# Patient Record
Sex: Female | Born: 1955 | Hispanic: No | Marital: Married | Smoking: Former smoker
Health system: Southern US, Community
[De-identification: ages and names within clinical notes are randomized; demographics above are authoritative.]

## PROBLEM LIST (undated history)

## (undated) DIAGNOSIS — G43909 Migraine, unspecified, not intractable, without status migrainosus: Secondary | ICD-10-CM

## (undated) DIAGNOSIS — M810 Age-related osteoporosis without current pathological fracture: Secondary | ICD-10-CM

## (undated) DIAGNOSIS — R011 Cardiac murmur, unspecified: Secondary | ICD-10-CM

## (undated) DIAGNOSIS — E559 Vitamin D deficiency, unspecified: Secondary | ICD-10-CM

## (undated) DIAGNOSIS — E119 Type 2 diabetes mellitus without complications: Secondary | ICD-10-CM

## (undated) DIAGNOSIS — E785 Hyperlipidemia, unspecified: Secondary | ICD-10-CM

## (undated) HISTORY — PX: BACK SURGERY: SHX140

## (undated) HISTORY — DX: Vitamin D deficiency, unspecified: E55.9

## (undated) HISTORY — PX: SHOULDER SURGERY: SHX246

## (undated) HISTORY — PX: ABDOMINAL HYSTERECTOMY: SHX81

## (undated) HISTORY — DX: Type 2 diabetes mellitus without complications: E11.9

## (undated) HISTORY — PX: APPENDECTOMY: SHX54

## (undated) HISTORY — DX: Cardiac murmur, unspecified: R01.1

## (undated) HISTORY — PX: CARPAL TUNNEL RELEASE: SHX101

## (undated) HISTORY — DX: Migraine, unspecified, not intractable, without status migrainosus: G43.909

## (undated) HISTORY — PX: TUBAL LIGATION: SHX77

## (undated) HISTORY — DX: Hyperlipidemia, unspecified: E78.5

## (undated) HISTORY — DX: Age-related osteoporosis without current pathological fracture: M81.0

---

## 2017-03-27 ENCOUNTER — Ambulatory Visit (INDEPENDENT_AMBULATORY_CARE_PROVIDER_SITE_OTHER): Payer: Commercial Managed Care - PPO | Admitting: Neurology

## 2017-03-27 ENCOUNTER — Encounter: Payer: Self-pay | Admitting: Neurology

## 2017-03-27 DIAGNOSIS — R531 Weakness: Secondary | ICD-10-CM

## 2017-03-27 DIAGNOSIS — R202 Paresthesia of skin: Secondary | ICD-10-CM

## 2017-03-27 DIAGNOSIS — M6281 Muscle weakness (generalized): Secondary | ICD-10-CM

## 2017-03-27 NOTE — Progress Notes (Signed)
PATIENT: Sheila Mcdowell DOB: September 09, 1955  Chief Complaint  Patient presents with  . Transient Ischemic Attack    She is concerned about having stroke- like symptoms.  She has been feeling off balance, easily drops things, numbness in face/lips, blurred vision and headaches.  Symptoms have been occurring over the last two months.  Says she had a normal CT scan of her head last month.  Her PCP started aspirin 81mg  daily as a precaution.  Marland Kitchen PCP    Doretha Sou, MD  . Insomnia     HISTORICAL  Sheila Mcdowell is a 61 year old female, seen in refer by her primary care doctor Doretha Sou for evaluation of transient ischemic attack, initial evaluation was on March 27 2017.  I reviewed and summarized the referring note, she has history of diabetes, hyperlipidemia, bilateral carpal tunnel release surgery, low back surgery November 2016,  She started to notice mild unsteady gait, left facial weakness, numbness, drop things from her hand especially left hand since August 2018, she could not recall the date of the symptoms onset, symptoms seems to start subacutely, over the past few months, mild improvement, she complains of left facial movements, asymmetry of left mouth corner, no dysarthria, no dysphagia, she also noticed when she pick up things, she tends to drop things from both hands, especially left hand, she denies significant neck pain, mild unsteady gait, no bowel and bladder incontinence,  She does have intermittent bilateral lower extremity paresthesia, achy pain, heaviness, difficulty walking   REVIEW OF SYSTEMS: Full 14 system review of systems performed and notable only for weight loss, palpitation, murmur, itching, blurry vision, snoring, constipation, increased thirst, joint pain, achy muscles, headaches, numbness insomnia sleepiness snoring  ALLERGIES: Allergies  Allergen Reactions  . Meperidine Nausea And Vomiting    Other reaction(s): RASH    HOME  MEDICATIONS: Current Outpatient Prescriptions  Medication Sig Dispense Refill  . alendronate (FOSAMAX) 70 MG tablet Take 70 mg by mouth once a week. Take with a full glass of water on an empty stomach.    . ASPIRIN 81 PO Take 81 mg by mouth daily.    Marland Kitchen gabapentin (NEURONTIN) 300 MG capsule Take 300 mg by mouth 3 (three) times daily.    . metFORMIN (GLUCOPHAGE) 500 MG tablet Take 500 mg by mouth 2 (two) times daily.    . Multiple Vitamins-Minerals (MULTIVITAMIN PO) Take 1 capsule by mouth daily.    Marland Kitchen omeprazole (PRILOSEC) 20 MG capsule Take 20 mg by mouth daily.    . rosuvastatin (CRESTOR) 10 MG tablet Take 10 mg by mouth daily.    Marland Kitchen terbinafine (LAMISIL) 250 MG tablet Take 250 mg by mouth daily.    Marland Kitchen topiramate (TOPAMAX) 50 MG tablet Take 50 mg by mouth daily.     No current facility-administered medications for this visit.     PAST MEDICAL HISTORY: Past Medical History:  Diagnosis Date  . Diabetes ([place])   . Heart murmur   . Hyperlipemia   . Migraine   . Osteoporosis   . Vitamin D deficiency     PAST SURGICAL HISTORY: Past Surgical History:  Procedure Laterality Date  . ABDOMINAL HYSTERECTOMY    . APPENDECTOMY    . BACK SURGERY    . CARPAL TUNNEL RELEASE Bilateral   . CESAREAN SECTION     x 2  . SHOULDER SURGERY Right   . TUBAL LIGATION      FAMILY HISTORY: Family History  Problem Relation Age of Onset  .  Heart disease Mother   . Diabetes Father   . Heart disease Father   . Hypertension Father   . Lung cancer Brother   . Hypertension Brother   . Diabetes Brother   . Diabetes Brother   . Diabetes Brother   . Heart attack Brother     SOCIAL HISTORY:  Social History   Social History  . Marital status: Married    Spouse name: N/A  . Number of children: 4  . Years of education: HS   Occupational History  . [employer]    Social History Main Topics  . Smoking status: Former Smoker    Types: Cigarettes  . Smokeless tobacco: Never Used     Comment:  03/27/17 - quit 4 years ago  . Alcohol use No  . Drug use: No  . Sexual activity: Not on file   Other Topics Concern  . Not on file   Social History Narrative   Lives at home husband.   Right-handed.   1 cup coffee per day.     PHYSICAL EXAM   Vitals:   03/27/17 1456  BP: 130/79  Pulse: 83  Weight: 150 lb 4 oz (68.2 kg)  Height: 4\' 11"  (1.499 m)    Not recorded      Body mass index is 30.35 kg/m.  PHYSICAL EXAMNIATION:  Gen: NAD, conversant, well nourised, obese, well groomed                     Cardiovascular: Regular rate rhythm, no peripheral edema, warm, nontender. Eyes: Conjunctivae clear without exudates or hemorrhage Neck: Supple, no carotid bruits. Pulmonary: Clear to auscultation bilaterally   NEUROLOGICAL EXAM:  MENTAL STATUS: Speech:    Speech is normal; fluent and spontaneous with normal comprehension.  Cognition:     Orientation to time, place and person     Normal recent and remote memory     Normal Attention span and concentration     Normal Language, naming, repeating,spontaneous speech     Fund of knowledge   CRANIAL NERVES: CN II: Visual fields are full to confrontation. Fundoscopic exam is normal with sharp discs and no vascular changes. Pupils are round equal and briskly reactive to light. CN III, IV, VI: extraocular movement are normal. No ptosis. CN V: Facial sensation is intact to pinprick in all 3 divisions bilaterally. Corneal responses are intact.  CN VII: Mild left lower face weakness CN VIII: Hearing is normal to rubbing fingers CN IX, X: Palate elevates symmetrically. Phonation is normal. CN XI: Head turning and shoulder shrug are intact CN XII: Tongue is midline with normal movements and no atrophy.  MOTOR: Mild fixation of left upper extremity on rapid rotating movement  REFLEXES: Reflexes are 2+ and symmetric at the biceps, triceps, knees, and ankles. Plantar responses are flexor.  SENSORY: Intact to light touch,  pinprick, positional sensation and vibratory sensation are intact in fingers and toes with exception of decreased light touch at the left V2 and V3 distribution  COORDINATION: Rapid alternating movements and fine finger movements are intact. There is no dysmetria on finger-to-nose and heel-knee-shin.    GAIT/STANCE: Posture is normal. Gait is steady with normal steps, base, arm swing, and turning. Heel and toe walking are normal. Tandem gait is normal.  Romberg is absent.   DIAGNOSTIC DATA (LABS, IMAGING, TESTING) - I reviewed patient records, labs, notes, testing and imaging myself where available.   ASSESSMENT AND PLAN  Allura Doepke is a 61 y.o.  female   Left facial and left upper extremity weakness, numbness  Localize the problem to right thalamus/posterior limb of internal capsule  She has vascular risk factor of aging, hypertension, hyperlipidemia  Complete evaluation with MRI of the brain  Increase water intake  Continue aspirin daily   Marcial Pacas, M.D. Ph.D.  [facility] [address], [place], [place] [address] Ph: [phone] Fax: [phone]  CC: Doretha Sou, MD

## 2017-04-05 ENCOUNTER — Ambulatory Visit (INDEPENDENT_AMBULATORY_CARE_PROVIDER_SITE_OTHER): Payer: Commercial Managed Care - PPO

## 2017-04-05 DIAGNOSIS — M6281 Muscle weakness (generalized): Secondary | ICD-10-CM

## 2017-04-06 ENCOUNTER — Telehealth: Payer: Self-pay | Admitting: *Deleted

## 2017-04-06 NOTE — Telephone Encounter (Signed)
Spoke with patient and informed her that there is no significant abnormality on her MRI of the brain. She had no questions, verbalized understanding.

## 2017-06-08 ENCOUNTER — Ambulatory Visit: Payer: Commercial Managed Care - PPO | Admitting: Neurology

## 2017-07-10 ENCOUNTER — Ambulatory Visit: Payer: Commercial Managed Care - PPO | Admitting: Neurology

## 2020-09-20 ENCOUNTER — Other Ambulatory Visit: Payer: Self-pay

## 2020-09-20 ENCOUNTER — Encounter (HOSPITAL_BASED_OUTPATIENT_CLINIC_OR_DEPARTMENT_OTHER): Payer: Self-pay | Admitting: *Deleted

## 2020-09-20 DIAGNOSIS — N178 Other acute kidney failure: Secondary | ICD-10-CM | POA: Insufficient documentation

## 2020-09-20 DIAGNOSIS — Z87891 Personal history of nicotine dependence: Secondary | ICD-10-CM | POA: Diagnosis not present

## 2020-09-20 DIAGNOSIS — Z79899 Other long term (current) drug therapy: Secondary | ICD-10-CM | POA: Diagnosis not present

## 2020-09-20 DIAGNOSIS — E876 Hypokalemia: Secondary | ICD-10-CM | POA: Diagnosis not present

## 2020-09-20 DIAGNOSIS — E119 Type 2 diabetes mellitus without complications: Secondary | ICD-10-CM | POA: Insufficient documentation

## 2020-09-20 DIAGNOSIS — N201 Calculus of ureter: Secondary | ICD-10-CM | POA: Diagnosis not present

## 2020-09-20 DIAGNOSIS — D3501 Benign neoplasm of right adrenal gland: Secondary | ICD-10-CM | POA: Diagnosis not present

## 2020-09-20 DIAGNOSIS — Z7984 Long term (current) use of oral hypoglycemic drugs: Secondary | ICD-10-CM | POA: Insufficient documentation

## 2020-09-20 DIAGNOSIS — R1084 Generalized abdominal pain: Secondary | ICD-10-CM | POA: Diagnosis present

## 2020-09-20 DIAGNOSIS — Z7982 Long term (current) use of aspirin: Secondary | ICD-10-CM | POA: Diagnosis not present

## 2020-09-20 NOTE — ED Triage Notes (Addendum)
RLQ pain for several days. BM normal. Denies urinary Sx. States"It feels like when I had my appendix out". Pt vomiting in triage

## 2020-09-21 ENCOUNTER — Emergency Department (HOSPITAL_BASED_OUTPATIENT_CLINIC_OR_DEPARTMENT_OTHER)
Admission: EM | Admit: 2020-09-21 | Discharge: 2020-09-21 | Disposition: A | Discharge status: Home / Self Care | Payer: Medicare Other | Attending: Emergency Medicine | Admitting: Emergency Medicine

## 2020-09-21 ENCOUNTER — Emergency Department (HOSPITAL_BASED_OUTPATIENT_CLINIC_OR_DEPARTMENT_OTHER): Payer: Medicare Other

## 2020-09-21 DIAGNOSIS — N201 Calculus of ureter: Secondary | ICD-10-CM

## 2020-09-21 DIAGNOSIS — E876 Hypokalemia: Secondary | ICD-10-CM

## 2020-09-21 DIAGNOSIS — D3501 Benign neoplasm of right adrenal gland: Secondary | ICD-10-CM

## 2020-09-21 DIAGNOSIS — N289 Disorder of kidney and ureter, unspecified: Secondary | ICD-10-CM

## 2020-09-21 LAB — CBC WITH DIFFERENTIAL/PLATELET
Abs Immature Granulocytes: 0.06 10*3/uL (ref 0.00–0.07)
Basophils Absolute: 0 10*3/uL (ref 0.0–0.1)
Basophils Relative: 0 %
Eosinophils Absolute: 0.1 10*3/uL (ref 0.0–0.5)
Eosinophils Relative: 1 %
HCT: 39.2 % (ref 36.0–46.0)
Hemoglobin: 12.9 g/dL (ref 12.0–15.0)
Immature Granulocytes: 1 %
Lymphocytes Relative: 16 %
Lymphs Abs: 1.8 10*3/uL (ref 0.7–4.0)
MCH: 28.2 pg (ref 26.0–34.0)
MCHC: 32.9 g/dL (ref 30.0–36.0)
MCV: 85.6 fL (ref 80.0–100.0)
Monocytes Absolute: 0.5 10*3/uL (ref 0.1–1.0)
Monocytes Relative: 4 %
Neutro Abs: 8.6 10*3/uL — ABNORMAL HIGH (ref 1.7–7.7)
Neutrophils Relative %: 78 %
Platelets: 253 10*3/uL (ref 150–400)
RBC: 4.58 MIL/uL (ref 3.87–5.11)
RDW: 13.9 % (ref 11.5–15.5)
WBC: 11 10*3/uL — ABNORMAL HIGH (ref 4.0–10.5)
nRBC: 0 % (ref 0.0–0.2)

## 2020-09-21 LAB — COMPREHENSIVE METABOLIC PANEL
ALT: 20 U/L (ref 0–44)
AST: 27 U/L (ref 15–41)
Albumin: 4.2 g/dL (ref 3.5–5.0)
Alkaline Phosphatase: 67 U/L (ref 38–126)
Anion gap: 12 (ref 5–15)
BUN: 24 mg/dL — ABNORMAL HIGH (ref 8–23)
CO2: 19 mmol/L — ABNORMAL LOW (ref 22–32)
Calcium: 9.4 mg/dL (ref 8.9–10.3)
Chloride: 107 mmol/L (ref 98–111)
Creatinine, Ser: 1.1 mg/dL — ABNORMAL HIGH (ref 0.44–1.00)
GFR, Estimated: 56 mL/min — ABNORMAL LOW (ref >60)
Glucose, Bld: 179 mg/dL — ABNORMAL HIGH (ref 70–99)
Potassium: 3.4 mmol/L — ABNORMAL LOW (ref 3.5–5.1)
Sodium: 138 mmol/L (ref 135–145)
Total Bilirubin: 0.3 mg/dL (ref 0.3–1.2)
Total Protein: 8.4 g/dL — ABNORMAL HIGH (ref 6.5–8.1)

## 2020-09-21 LAB — URINALYSIS, ROUTINE W REFLEX MICROSCOPIC
Bilirubin Urine: NEGATIVE
Glucose, UA: NEGATIVE mg/dL
Ketones, ur: NEGATIVE mg/dL
Leukocytes,Ua: NEGATIVE
Nitrite: NEGATIVE
Protein, ur: 30 mg/dL — AB
Specific Gravity, Urine: 1.02 (ref 1.005–1.030)
pH: 7 (ref 5.0–8.0)

## 2020-09-21 LAB — URINALYSIS, MICROSCOPIC (REFLEX)

## 2020-09-21 LAB — LIPASE, BLOOD: Lipase: 57 U/L — ABNORMAL HIGH (ref 11–51)

## 2020-09-21 MED ORDER — LACTATED RINGERS IV BOLUS
1000.0000 mL | Freq: Once | INTRAVENOUS | Status: AC
  Administered 2020-09-21: 1000 mL via INTRAVENOUS

## 2020-09-21 MED ORDER — MORPHINE SULFATE (PF) 4 MG/ML IV SOLN
4.0000 mg | Freq: Once | INTRAVENOUS | Status: AC
  Administered 2020-09-21: 4 mg via INTRAVENOUS
  Filled 2020-09-21: qty 1

## 2020-09-21 MED ORDER — OXYCODONE HCL 5 MG PO TABS: 5.0000 mg | ORAL_TABLET | ORAL | 0 refills | Status: DC | PRN

## 2020-09-21 MED ORDER — ONDANSETRON HCL 4 MG PO TABS: 4.0000 mg | ORAL_TABLET | Freq: Four times a day (QID) | ORAL | 0 refills | Status: DC | PRN

## 2020-09-21 MED ORDER — POTASSIUM CHLORIDE CRYS ER 20 MEQ PO TBCR
40.0000 meq | EXTENDED_RELEASE_TABLET | Freq: Once | ORAL | Status: AC
  Administered 2020-09-21: 40 meq via ORAL
  Filled 2020-09-21: qty 2

## 2020-09-21 MED ORDER — TAMSULOSIN HCL 0.4 MG PO CAPS: 0.4000 mg | ORAL_CAPSULE | Freq: Every day | ORAL | 0 refills | Status: DC

## 2020-09-21 MED ORDER — ONDANSETRON HCL 4 MG/2ML IJ SOLN
4.0000 mg | Freq: Once | INTRAMUSCULAR | Status: AC
  Administered 2020-09-21: 4 mg via INTRAVENOUS
  Filled 2020-09-21: qty 2

## 2020-09-21 MED ORDER — IOHEXOL 300 MG/ML  SOLN
100.0000 mL | Freq: Once | INTRAMUSCULAR | Status: AC | PRN
  Administered 2020-09-21: 100 mL via INTRAVENOUS

## 2020-09-21 NOTE — ED Provider Notes (Signed)
[facility] EMERGENCY DEPARTMENT Provider Note   CSN: [id number] Arrival date & time: 09/20/20  2336    History Chief Complaint  Patient presents with  . Abdominal Pain    Sheila Mcdowell is a 65 y.o. female.  The history is provided by the patient.  Abdominal Pain She has history of diabetes, hyperlipidemia and comes in complaining of generalized abdominal pain for the last 2 days.  Pain does radiate into her back.  It is worse when she eats.  There has been associated nausea and vomiting.  She denies diarrhea and has been having normal bowel movements.  Pain is not affected by bowel movement or emesis.  She denies fever, chills, sweats.  She rates pain at 10/10.  Pain is as severe as what she had with appendicitis although appendicitis pain was much more localized.  Of note, she has had several abdominal surgeries.  Past Medical History:  Diagnosis Date  . Diabetes ([place])   . Heart murmur   . Hyperlipemia   . Migraine   . Osteoporosis   . Vitamin D deficiency     Patient Active Problem List   Diagnosis Date Noted  . Left-sided weakness 03/27/2017  . Paresthesia 03/27/2017  . Muscle weakness (generalized) 03/27/2017    Past Surgical History:  Procedure Laterality Date  . ABDOMINAL HYSTERECTOMY    . APPENDECTOMY    . BACK SURGERY    . CARPAL TUNNEL RELEASE Bilateral   . CESAREAN SECTION     x 2  . SHOULDER SURGERY Right   . TUBAL LIGATION       OB History   No obstetric history on file.     Family History  Problem Relation Age of Onset  . Heart disease Mother   . Diabetes Father   . Heart disease Father   . Hypertension Father   . Lung cancer Brother   . Hypertension Brother   . Diabetes Brother   . Diabetes Brother   . Diabetes Brother   . Heart attack Brother     Social History   Tobacco Use  . Smoking status: Former Smoker    Types: Cigarettes  . Smokeless tobacco: Never Used  . Tobacco comment: 03/27/17 - quit 4 years ago  Vaping  Use  . Vaping Use: Never used  Substance Use Topics  . Alcohol use: No  . Drug use: No    Home Medications Prior to Admission medications   Medication Sig Start Date End Date Taking? Authorizing Provider  alendronate (FOSAMAX) 70 MG tablet Take 70 mg by mouth once a week. Take with a full glass of water on an empty stomach.    [provider]  ASPIRIN 81 PO Take 81 mg by mouth daily.    [provider]  gabapentin (NEURONTIN) 300 MG capsule Take 300 mg by mouth 3 (three) times daily. 01/12/17   [provider]  metFORMIN (GLUCOPHAGE) 500 MG tablet Take 500 mg by mouth 2 (two) times daily.    [provider]  Multiple Vitamins-Minerals (MULTIVITAMIN PO) Take 1 capsule by mouth daily.    [provider]  omeprazole (PRILOSEC) 20 MG capsule Take 20 mg by mouth daily.    [provider]  rosuvastatin (CRESTOR) 10 MG tablet Take 10 mg by mouth daily. 02/22/17   [provider]  terbinafine (LAMISIL) 250 MG tablet Take 250 mg by mouth daily. 03/20/17   [provider]  topiramate (TOPAMAX) 50 MG tablet Take 50 mg  by mouth daily. 03/23/17   [provider]    Allergies    Meperidine  Review of Systems   Review of Systems  Gastrointestinal: Positive for abdominal pain.  All other systems reviewed and are negative.   Physical Exam Updated Vital Signs BP (!) 165/98 (BP Location: Left Arm)   Pulse 61   Temp 98.5 F (36.9 C) (Oral)   Resp 16   Ht 4\' 11"  (1.499 m)   Wt 77.1 kg   SpO2 98%   BMI 34.34 kg/m   Physical Exam Vitals and nursing note reviewed.   65 year old female, resting comfortably and in no acute distress. Vital signs are significant for elevated blood pressure. Oxygen saturation is 98%, which is normal. Head is normocephalic and atraumatic. PERRLA, EOMI. Oropharynx is clear. Neck is nontender and supple without adenopathy or JVD. Back is nontender and there is no CVA tenderness. Lungs are  clear without rales, wheezes, or rhonchi. Chest is nontender. Heart has regular rate and rhythm without murmur. Abdomen is soft, flat, with mild tenderness diffusely.  There is no rebound or guarding.  There are no masses or hepatosplenomegaly and peristalsis is slightly hypoactive. Extremities have no cyanosis or edema, full range of motion is present. Skin is warm and dry without rash. Neurologic: Mental status is normal, cranial nerves are intact, there are no motor or sensory deficits.  ED Results / Procedures / Treatments   Labs (all labs ordered are listed, but only abnormal results are displayed) Labs Reviewed  LIPASE, BLOOD - Abnormal; Notable for the following components:      Result Value   Lipase 57 (*)    All other components within normal limits  COMPREHENSIVE METABOLIC PANEL - Abnormal; Notable for the following components:   Potassium 3.4 (*)    CO2 19 (*)    Glucose, Bld 179 (*)    BUN 24 (*)    Creatinine, Ser 1.10 (*)    Total Protein 8.4 (*)    GFR, Estimated 56 (*)    All other components within normal limits  CBC WITH DIFFERENTIAL/PLATELET - Abnormal; Notable for the following components:   WBC 11.0 (*)    Neutro Abs 8.6 (*)    All other components within normal limits  URINALYSIS, ROUTINE W REFLEX MICROSCOPIC - Abnormal; Notable for the following components:   Hgb urine dipstick MODERATE (*)    Protein, ur 30 (*)    All other components within normal limits  URINALYSIS, MICROSCOPIC (REFLEX) - Abnormal; Notable for the following components:   Bacteria, UA RARE (*)    All other components within normal limits   Radiology CT ABDOMEN PELVIS W CONTRAST  Result Date: 09/21/2020 CLINICAL DATA:  Right lower quadrant pain EXAM: CT ABDOMEN AND PELVIS WITH CONTRAST TECHNIQUE: Multidetector CT imaging of the abdomen and pelvis was performed using the standard protocol following bolus administration of intravenous contrast. CONTRAST:  125mL OMNIPAQUE IOHEXOL 300 MG/ML   SOLN COMPARISON:  12/04/2016 FINDINGS: Lower chest: Lung bases are clear. No effusions. Heart is normal size. Hepatobiliary: Diffuse low-density throughout the liver compatible with fatty infiltration. No focal abnormality. Gallbladder unremarkable. Pancreas: No focal abnormality or ductal dilatation. Spleen: No focal abnormality.  Normal size. Adrenals/Urinary Tract: Moderate right hydronephrosis and hydroureter due to 3 mm distal right ureteral stone just beyond the pelvic brim. Bilateral nephrolithiasis. Perinephric stranding around the right kidney. Slight delayed excretion of contrast. 3.7 cm mass in the right adrenal gland is similar to prior study most  compatible with adenoma given the stability over several years. Left adrenal gland and urinary bladder unremarkable. Stomach/Bowel: Stomach, large and small bowel grossly unremarkable. Vascular/Lymphatic: Aortoiliac atherosclerosis. No evidence of aneurysm or adenopathy. Reproductive: Prior hysterectomy.  No adnexal masses. Other: No free fluid or free air. Musculoskeletal: No acute bony abnormality. IMPRESSION: 3 mm distal right ureteral stone. Mild right hydronephrosis and perinephric stranding. Bilateral nephrolithiasis. Hepatic steatosis. Aortic atherosclerosis. Electronically Signed   By: Rolm Baptise M.D.   On: 09/21/2020 03:03    Procedures Procedures   Medications Ordered in ED Medications  ondansetron (ZOFRAN) injection 4 mg (has no administration in time range)  morphine 4 MG/ML injection 4 mg (has no administration in time range)  lactated ringers bolus 1,000 mL (has no administration in time range)    ED Course  I have reviewed the triage vital signs and the nursing notes.  Pertinent labs & imaging results that were available during my care of the patient were reviewed by me and considered in my medical decision making (see chart for details).  MDM Rules/Calculators/A&P   Generalized abdominal pain with vomiting, etiology  unclear.  Consider diverticulitis, small bowel obstruction, pancreatitis, ureterolithiasis.  This differential includes conditions with significant morbidity and mortality.  Old records were reviewed,, and she has no relevant past visits.  CT abdomen and pelvis on 12/04/2016 at [facility] showed a lower abdominal wall hernia, right adrenal gland adenoma, fatty infiltration of the liver, aortic and iliac atherosclerosis.  She will be given IV fluids, ondansetron, morphine.  Screening labs are obtained and will send for CT of abdomen and pelvis.  Urinalysis does show hematuria.  Metabolic panel shows mild renal insufficiency and mild hypokalemia.  She is given a dose of oral potassium.  CBC shows mild leukocytosis without left shift.  CT scan shows stable adrenal adenoma, 3 mm distal right ureteral calculus with right hydronephrosis.  This is apparently the cause of her pain.  She got good relief of pain with morphine.  She is discharged with prescriptions for tamsulosin, oxycodone, ondansetron and is referred to urology for follow-up.  Return precautions discussed.  Final Clinical Impression(s) / ED Diagnoses Final diagnoses:  Ureterolithiasis  Hypokalemia  Renal insufficiency  Adrenal adenoma, right    Rx / DC Orders ED Discharge Orders         Ordered    tamsulosin (FLOMAX) 0.4 MG CAPS capsule  Daily        09/21/20 0331    oxyCODONE (ROXICODONE) 5 MG immediate release tablet  Every 4 hours PRN        09/21/20 0331    ondansetron (ZOFRAN) 4 MG tablet  Every 6 hours PRN        09/21/20 2956           Delora Fuel, MD 21/30/86 [phone]

## 2020-09-21 NOTE — Discharge Instructions (Signed)
Your CT scan shows you have a kidney stone.  The stone is likely to pass by itself.  However, if your pain is not being adequately controlled or if you start running a fever, return to the emergency department immediately.  Please follow-up with the urologist.  If the stone does not pass by itself, the urologist has several techniques to try to get the stone to pass.

## 2020-10-02 ENCOUNTER — Emergency Department (HOSPITAL_BASED_OUTPATIENT_CLINIC_OR_DEPARTMENT_OTHER)
Admission: EM | Admit: 2020-10-02 | Discharge: 2020-10-02 | Disposition: A | Discharge status: Home / Self Care | Payer: Medicare Other | Attending: Emergency Medicine | Admitting: Emergency Medicine

## 2020-10-02 ENCOUNTER — Emergency Department (HOSPITAL_BASED_OUTPATIENT_CLINIC_OR_DEPARTMENT_OTHER): Payer: Medicare Other

## 2020-10-02 ENCOUNTER — Encounter (HOSPITAL_BASED_OUTPATIENT_CLINIC_OR_DEPARTMENT_OTHER): Payer: Self-pay

## 2020-10-02 ENCOUNTER — Other Ambulatory Visit: Payer: Self-pay

## 2020-10-02 DIAGNOSIS — N202 Calculus of kidney with calculus of ureter: Secondary | ICD-10-CM | POA: Insufficient documentation

## 2020-10-02 DIAGNOSIS — E119 Type 2 diabetes mellitus without complications: Secondary | ICD-10-CM | POA: Diagnosis not present

## 2020-10-02 DIAGNOSIS — R109 Unspecified abdominal pain: Secondary | ICD-10-CM | POA: Diagnosis present

## 2020-10-02 DIAGNOSIS — Z7984 Long term (current) use of oral hypoglycemic drugs: Secondary | ICD-10-CM | POA: Diagnosis not present

## 2020-10-02 DIAGNOSIS — Z7982 Long term (current) use of aspirin: Secondary | ICD-10-CM | POA: Insufficient documentation

## 2020-10-02 DIAGNOSIS — Z87891 Personal history of nicotine dependence: Secondary | ICD-10-CM | POA: Insufficient documentation

## 2020-10-02 DIAGNOSIS — N200 Calculus of kidney: Secondary | ICD-10-CM

## 2020-10-02 LAB — CBC WITH DIFFERENTIAL/PLATELET
Abs Immature Granulocytes: 0.04 10*3/uL (ref 0.00–0.07)
Basophils Absolute: 0.1 10*3/uL (ref 0.0–0.1)
Basophils Relative: 1 %
Eosinophils Absolute: 0.1 10*3/uL (ref 0.0–0.5)
Eosinophils Relative: 1 %
HCT: 39.8 % (ref 36.0–46.0)
Hemoglobin: 13.1 g/dL (ref 12.0–15.0)
Immature Granulocytes: 1 %
Lymphocytes Relative: 33 %
Lymphs Abs: 2.9 10*3/uL (ref 0.7–4.0)
MCH: 28.4 pg (ref 26.0–34.0)
MCHC: 32.9 g/dL (ref 30.0–36.0)
MCV: 86.3 fL (ref 80.0–100.0)
Monocytes Absolute: 0.5 10*3/uL (ref 0.1–1.0)
Monocytes Relative: 6 %
Neutro Abs: 5.2 10*3/uL (ref 1.7–7.7)
Neutrophils Relative %: 58 %
Platelets: 274 10*3/uL (ref 150–400)
RBC: 4.61 MIL/uL (ref 3.87–5.11)
RDW: 13.8 % (ref 11.5–15.5)
WBC: 8.8 10*3/uL (ref 4.0–10.5)
nRBC: 0 % (ref 0.0–0.2)

## 2020-10-02 LAB — URINALYSIS, MICROSCOPIC (REFLEX)

## 2020-10-02 LAB — COMPREHENSIVE METABOLIC PANEL
ALT: 20 U/L (ref 0–44)
AST: 28 U/L (ref 15–41)
Albumin: 4.1 g/dL (ref 3.5–5.0)
Alkaline Phosphatase: 69 U/L (ref 38–126)
Anion gap: 10 (ref 5–15)
BUN: 19 mg/dL (ref 8–23)
CO2: 20 mmol/L — ABNORMAL LOW (ref 22–32)
Calcium: 9.3 mg/dL (ref 8.9–10.3)
Chloride: 107 mmol/L (ref 98–111)
Creatinine, Ser: 1.16 mg/dL — ABNORMAL HIGH (ref 0.44–1.00)
GFR, Estimated: 52 mL/min — ABNORMAL LOW (ref >60)
Glucose, Bld: 116 mg/dL — ABNORMAL HIGH (ref 70–99)
Potassium: 4 mmol/L (ref 3.5–5.1)
Sodium: 137 mmol/L (ref 135–145)
Total Bilirubin: 0.3 mg/dL (ref 0.3–1.2)
Total Protein: 8 g/dL (ref 6.5–8.1)

## 2020-10-02 LAB — URINALYSIS, ROUTINE W REFLEX MICROSCOPIC
Bilirubin Urine: NEGATIVE
Glucose, UA: NEGATIVE mg/dL
Ketones, ur: NEGATIVE mg/dL
Leukocytes,Ua: NEGATIVE
Nitrite: NEGATIVE
Protein, ur: 100 mg/dL — AB
Specific Gravity, Urine: 1.02 (ref 1.005–1.030)
pH: 8 (ref 5.0–8.0)

## 2020-10-02 LAB — LIPASE, BLOOD: Lipase: 48 U/L (ref 11–51)

## 2020-10-02 MED ORDER — ONDANSETRON HCL 4 MG PO TABS: 4.0000 mg | ORAL_TABLET | Freq: Four times a day (QID) | ORAL | 0 refills | Status: AC | PRN

## 2020-10-02 MED ORDER — OXYCODONE HCL 5 MG PO TABS: 5.0000 mg | ORAL_TABLET | Freq: Four times a day (QID) | ORAL | 0 refills | Status: AC | PRN

## 2020-10-02 MED ORDER — FENTANYL CITRATE (PF) 100 MCG/2ML IJ SOLN
50.0000 ug | Freq: Once | INTRAMUSCULAR | Status: AC
Start: 2020-10-02 — End: 2020-10-02
  Administered 2020-10-02: 50 ug via INTRAVENOUS
  Filled 2020-10-02: qty 2

## 2020-10-02 MED ORDER — KETOROLAC TROMETHAMINE 30 MG/ML IJ SOLN
30.0000 mg | Freq: Once | INTRAMUSCULAR | Status: AC
  Administered 2020-10-02: 30 mg via INTRAVENOUS
  Filled 2020-10-02: qty 1

## 2020-10-02 MED ORDER — ONDANSETRON HCL 4 MG/2ML IJ SOLN
4.0000 mg | Freq: Once | INTRAMUSCULAR | Status: AC
  Administered 2020-10-02: 4 mg via INTRAVENOUS
  Filled 2020-10-02: qty 2

## 2020-10-02 MED ORDER — TAMSULOSIN HCL 0.4 MG PO CAPS: 0.4000 mg | ORAL_CAPSULE | Freq: Every day | ORAL | 0 refills | Status: DC

## 2020-10-02 NOTE — Discharge Instructions (Signed)
Follow-up with urology as already in place.  Please return if you have worsening pain, uncontrollable nausea and vomiting, fever.

## 2020-10-02 NOTE — ED Provider Notes (Signed)
[facility] EMERGENCY DEPARTMENT Provider Note   CSN: [id number] Arrival date & time: 10/02/20  4098     History Chief Complaint  Patient presents with  . Abdominal Pain    Sheila Mcdowell is a 65 y.o. female.  The history is provided by the patient.  Abdominal Pain Pain location:  L flank Pain quality: aching   Pain radiates to:  Groin Pain severity:  Mild Onset quality:  Gradual Timing:  Intermittent Progression:  Waxing and waning Chronicity:  New Context comment:  Recent kidney stone Relieved by:  Nothing Worsened by:  Nothing Associated symptoms: nausea and vomiting   Associated symptoms: no chest pain, no chills, no cough, no dysuria, no fever, no hematuria, no shortness of breath and no sore throat        Past Medical History:  Diagnosis Date  . Diabetes ([place])   . Heart murmur   . Hyperlipemia   . Migraine   . Osteoporosis   . Vitamin D deficiency     Patient Active Problem List   Diagnosis Date Noted  . Left-sided weakness 03/27/2017  . Paresthesia 03/27/2017  . Muscle weakness (generalized) 03/27/2017    Past Surgical History:  Procedure Laterality Date  . ABDOMINAL HYSTERECTOMY    . APPENDECTOMY    . BACK SURGERY    . CARPAL TUNNEL RELEASE Bilateral   . CESAREAN SECTION     x 2  . SHOULDER SURGERY Right   . TUBAL LIGATION       OB History   No obstetric history on file.     Family History  Problem Relation Age of Onset  . Heart disease Mother   . Diabetes Father   . Heart disease Father   . Hypertension Father   . Lung cancer Brother   . Hypertension Brother   . Diabetes Brother   . Diabetes Brother   . Diabetes Brother   . Heart attack Brother     Social History   Tobacco Use  . Smoking status: Former Smoker    Types: Cigarettes  . Smokeless tobacco: Never Used  . Tobacco comment: 03/27/17 - quit 4 years ago  Vaping Use  . Vaping Use: Never used  Substance Use Topics  . Alcohol use: No  . Drug use: No     Home Medications Prior to Admission medications   Medication Sig Start Date End Date Taking? Authorizing Provider  alendronate (FOSAMAX) 70 MG tablet Take 70 mg by mouth once a week. Take with a full glass of water on an empty stomach.    [provider]  ASPIRIN 81 PO Take 81 mg by mouth daily.    [provider]  gabapentin (NEURONTIN) 300 MG capsule Take 300 mg by mouth 3 (three) times daily. 01/12/17   [provider]  metFORMIN (GLUCOPHAGE) 500 MG tablet Take 500 mg by mouth 2 (two) times daily.    [provider]  Multiple Vitamins-Minerals (MULTIVITAMIN PO) Take 1 capsule by mouth daily.    [provider]  omeprazole (PRILOSEC) 20 MG capsule Take 20 mg by mouth daily.    [provider]  ondansetron (ZOFRAN) 4 MG tablet Take 1 tablet (4 mg total) by mouth every 6 (six) hours as needed for nausea or vomiting. 10/02/20   Laressa Bolinger, DO  oxyCODONE (ROXICODONE) 5 MG immediate release tablet Take 1 tablet (5 mg total) by mouth every 6 (six) hours as needed for up to 20 doses for severe pain. 10/02/20  Zarria Towell, DO  rosuvastatin (CRESTOR) 10 MG tablet Take 10 mg by mouth daily. 02/22/17   [provider]  tamsulosin (FLOMAX) 0.4 MG CAPS capsule Take 1 capsule (0.4 mg total) by mouth daily. 10/02/20   Jacqueline Delapena, DO  terbinafine (LAMISIL) 250 MG tablet Take 250 mg by mouth daily. 03/20/17   [provider]  topiramate (TOPAMAX) 50 MG tablet Take 50 mg by mouth daily. 03/23/17   [provider]    Allergies    Meperidine  Review of Systems   Review of Systems  Constitutional: Negative for chills and fever.  HENT: Negative for ear pain and sore throat.   Eyes: Negative for pain and visual disturbance.  Respiratory: Negative for cough and shortness of breath.   Cardiovascular: Negative for chest pain and palpitations.  Gastrointestinal: Positive for abdominal pain, nausea and vomiting.   Genitourinary: Negative for dysuria and hematuria.  Musculoskeletal: Positive for back pain. Negative for arthralgias.  Skin: Negative for color change and rash.  Neurological: Negative for seizures and syncope.  All other systems reviewed and are negative.   Physical Exam Updated Vital Signs BP (!) 157/99 (BP Location: Right Arm)   Pulse 70   Temp 98.1 F (36.7 C) (Oral)   Resp 16   Ht 4\' 11"  (1.499 m)   Wt 78 kg   SpO2 99%   BMI 34.73 kg/m   Physical Exam Vitals and nursing note reviewed.  Constitutional:      General: She is not in acute distress.    Appearance: She is well-developed. She is not ill-appearing.  HENT:     Head: Normocephalic and atraumatic.     Nose: Nose normal.     Mouth/Throat:     Mouth: Mucous membranes are moist.  Eyes:     Extraocular Movements: Extraocular movements intact.     Conjunctiva/sclera: Conjunctivae normal.     Pupils: Pupils are equal, round, and reactive to light.  Cardiovascular:     Rate and Rhythm: Normal rate and regular rhythm.     Pulses: Normal pulses.     Heart sounds: Normal heart sounds. No murmur heard.   Pulmonary:     Effort: Pulmonary effort is normal. No respiratory distress.     Breath sounds: Normal breath sounds.  Abdominal:     Palpations: Abdomen is soft.     Tenderness: There is left CVA tenderness. There is no guarding or rebound.  Musculoskeletal:     Cervical back: Normal range of motion and neck supple.  Skin:    General: Skin is warm and dry.     Capillary Refill: Capillary refill takes less than 2 seconds.  Neurological:     General: No focal deficit present.     Mental Status: She is alert and oriented to person, place, and time.     Cranial Nerves: No cranial nerve deficit.     Sensory: No sensory deficit.     Motor: No weakness.     ED Results / Procedures / Treatments   Labs (all labs ordered are listed, but only abnormal results are displayed) Labs Reviewed  COMPREHENSIVE  METABOLIC PANEL - Abnormal; Notable for the following components:      Result Value   CO2 20 (*)    Glucose, Bld 116 (*)    Creatinine, Ser 1.16 (*)    GFR, Estimated 52 (*)    All other components within normal limits  URINALYSIS, ROUTINE W REFLEX MICROSCOPIC - Abnormal; Notable for the  following components:   APPearance CLOUDY (*)    Hgb urine dipstick SMALL (*)    Protein, ur 100 (*)    All other components within normal limits  URINALYSIS, MICROSCOPIC (REFLEX) - Abnormal; Notable for the following components:   Bacteria, UA MANY (*)    All other components within normal limits  CBC WITH DIFFERENTIAL/PLATELET  LIPASE, BLOOD    EKG None  Radiology No results found.  Procedures Procedures   Medications Ordered in ED Medications  fentaNYL (SUBLIMAZE) injection 50 mcg (50 mcg Intravenous Given 10/02/20 1021)  ondansetron (ZOFRAN) injection 4 mg (4 mg Intravenous Given 10/02/20 1021)    ED Course  I have reviewed the triage vital signs and the nursing notes.  Pertinent labs & imaging results that were available during my care of the patient were reviewed by me and considered in my medical decision making (see chart for details).    MDM Rules/Calculators/A&P                          Bluma Buresh is here with lower abdominal pain, left flank pain.  Diagnosed with a kidney stone on the right side several weeks ago.  Overall she does not know if she has passed a kidney stone.  Pain has come and gone but much worse today on the left side.  Concern for kidney stone versus urine infection versus possibly diverticulitis.  We will get a CT scan to further evaluate.  We will get basic labs.  Will check urine.  Will give IV fentanyl and IV Zofran.  No fever and normal vitals otherwise.  Follow-up with urology in 2 weeks, has not seen them yet.  2 small left-sided punctate ureter stones.  Overall unremarkable lab work otherwise.  No fever, no leukocytosis.  Urinalysis overall does not  appear to be consistent with infection.  Feeling better after pain medications.  We will have her continue follow-up with urology.  She understands return precautions.  This chart was dictated using voice recognition software.  Despite best efforts to proofread,  errors can occur which can change the documentation meaning.    Final Clinical Impression(s) / ED Diagnoses Final diagnoses:  Kidney stone    Rx / DC Orders ED Discharge Orders         Ordered    oxyCODONE (ROXICODONE) 5 MG immediate release tablet  Every 6 hours PRN        10/02/20 1109    tamsulosin (FLOMAX) 0.4 MG CAPS capsule  Daily        10/02/20 1109    ondansetron (ZOFRAN) 4 MG tablet  Every 6 hours PRN        10/02/20 1109           Emric Kowalewski, DO 10/02/20 1109

## 2020-10-02 NOTE — ED Triage Notes (Signed)
Pt was diagnosed with kidney stone 2 weeks ago, states pain has been on and off, today increased pain and pressure, nausea, vomiting, left flank pain radiating into groin.  Took Ibupofen 800mg   At 0600

## 2020-10-02 NOTE — ED Notes (Addendum)
Pt ambulated without difficulty to and from CT and toilet.  Pt laying calm and comfortable, aside from flank pain, in bed at present.  Daughter at bedside.

## 2021-09-10 ENCOUNTER — Emergency Department (HOSPITAL_BASED_OUTPATIENT_CLINIC_OR_DEPARTMENT_OTHER)
Admission: EM | Admit: 2021-09-10 | Discharge: 2021-09-10 | Disposition: A | Discharge status: Home / Self Care | Payer: Medicare Other | Attending: Emergency Medicine | Admitting: Emergency Medicine

## 2021-09-10 ENCOUNTER — Emergency Department (HOSPITAL_BASED_OUTPATIENT_CLINIC_OR_DEPARTMENT_OTHER): Payer: Medicare Other

## 2021-09-10 ENCOUNTER — Encounter (HOSPITAL_BASED_OUTPATIENT_CLINIC_OR_DEPARTMENT_OTHER): Payer: Self-pay | Admitting: Emergency Medicine

## 2021-09-10 ENCOUNTER — Other Ambulatory Visit: Payer: Self-pay

## 2021-09-10 DIAGNOSIS — Z7982 Long term (current) use of aspirin: Secondary | ICD-10-CM | POA: Diagnosis not present

## 2021-09-10 DIAGNOSIS — N132 Hydronephrosis with renal and ureteral calculous obstruction: Secondary | ICD-10-CM | POA: Insufficient documentation

## 2021-09-10 DIAGNOSIS — Z7984 Long term (current) use of oral hypoglycemic drugs: Secondary | ICD-10-CM | POA: Insufficient documentation

## 2021-09-10 DIAGNOSIS — R1032 Left lower quadrant pain: Secondary | ICD-10-CM | POA: Diagnosis present

## 2021-09-10 DIAGNOSIS — R944 Abnormal results of kidney function studies: Secondary | ICD-10-CM | POA: Diagnosis not present

## 2021-09-10 DIAGNOSIS — N200 Calculus of kidney: Secondary | ICD-10-CM

## 2021-09-10 LAB — URINALYSIS, ROUTINE W REFLEX MICROSCOPIC
Bilirubin Urine: NEGATIVE
Glucose, UA: NEGATIVE mg/dL
Ketones, ur: NEGATIVE mg/dL
Leukocytes,Ua: NEGATIVE
Nitrite: NEGATIVE
Protein, ur: 100 mg/dL — AB
Specific Gravity, Urine: >=1.03 (ref 1.005–1.030)
pH: 6.5 (ref 5.0–8.0)

## 2021-09-10 LAB — CBC WITH DIFFERENTIAL/PLATELET
Abs Immature Granulocytes: 0.01 10*3/uL (ref 0.00–0.07)
Basophils Absolute: 0.1 10*3/uL (ref 0.0–0.1)
Basophils Relative: 1 %
Eosinophils Absolute: 0.1 10*3/uL (ref 0.0–0.5)
Eosinophils Relative: 1 %
HCT: 40.2 % (ref 36.0–46.0)
Hemoglobin: 13.3 g/dL (ref 12.0–15.0)
Immature Granulocytes: 0 %
Lymphocytes Relative: 36 %
Lymphs Abs: 2.9 10*3/uL (ref 0.7–4.0)
MCH: 27.7 pg (ref 26.0–34.0)
MCHC: 33.1 g/dL (ref 30.0–36.0)
MCV: 83.8 fL (ref 80.0–100.0)
Monocytes Absolute: 0.6 10*3/uL (ref 0.1–1.0)
Monocytes Relative: 8 %
Neutro Abs: 4.3 10*3/uL (ref 1.7–7.7)
Neutrophils Relative %: 54 %
Platelets: 278 10*3/uL (ref 150–400)
RBC: 4.8 MIL/uL (ref 3.87–5.11)
RDW: 13.8 % (ref 11.5–15.5)
WBC: 8 10*3/uL (ref 4.0–10.5)
nRBC: 0 % (ref 0.0–0.2)

## 2021-09-10 LAB — COMPREHENSIVE METABOLIC PANEL
ALT: 12 U/L (ref 0–44)
AST: 22 U/L (ref 15–41)
Albumin: 4.1 g/dL (ref 3.5–5.0)
Alkaline Phosphatase: 65 U/L (ref 38–126)
Anion gap: 10 (ref 5–15)
BUN: 20 mg/dL (ref 8–23)
CO2: 19 mmol/L — ABNORMAL LOW (ref 22–32)
Calcium: 9.5 mg/dL (ref 8.9–10.3)
Chloride: 111 mmol/L (ref 98–111)
Creatinine, Ser: 1.15 mg/dL — ABNORMAL HIGH (ref 0.44–1.00)
GFR, Estimated: 53 mL/min — ABNORMAL LOW (ref >60)
Glucose, Bld: 141 mg/dL — ABNORMAL HIGH (ref 70–99)
Potassium: 3.8 mmol/L (ref 3.5–5.1)
Sodium: 140 mmol/L (ref 135–145)
Total Bilirubin: 0.8 mg/dL (ref 0.3–1.2)
Total Protein: 7.8 g/dL (ref 6.5–8.1)

## 2021-09-10 LAB — URINALYSIS, MICROSCOPIC (REFLEX)

## 2021-09-10 MED ORDER — IOHEXOL 350 MG/ML SOLN
100.0000 mL | Freq: Once | INTRAVENOUS | Status: AC | PRN
  Administered 2021-09-10: 100 mL via INTRAVENOUS

## 2021-09-10 MED ORDER — ONDANSETRON HCL 4 MG/2ML IJ SOLN
4.0000 mg | Freq: Once | INTRAMUSCULAR | Status: AC
  Administered 2021-09-10: 4 mg via INTRAVENOUS
  Filled 2021-09-10: qty 2

## 2021-09-10 MED ORDER — OXYCODONE-ACETAMINOPHEN 5-325 MG PO TABS: 1.0000 | ORAL_TABLET | Freq: Four times a day (QID) | ORAL | 0 refills | Status: AC | PRN

## 2021-09-10 MED ORDER — LACTATED RINGERS IV BOLUS
1000.0000 mL | Freq: Once | INTRAVENOUS | Status: AC
  Administered 2021-09-10: 1000 mL via INTRAVENOUS

## 2021-09-10 MED ORDER — TAMSULOSIN HCL 0.4 MG PO CAPS: 0.4000 mg | ORAL_CAPSULE | Freq: Every day | ORAL | 0 refills | Status: AC

## 2021-09-10 MED ORDER — MORPHINE SULFATE (PF) 4 MG/ML IV SOLN
4.0000 mg | Freq: Once | INTRAVENOUS | Status: AC
  Administered 2021-09-10: 4 mg via INTRAVENOUS
  Filled 2021-09-10: qty 1

## 2021-09-10 NOTE — ED Notes (Signed)
Pt ambulatory to waiting room. Pt verbalized understanding of discharge instructions.   

## 2021-09-10 NOTE — ED Provider Notes (Signed)
?[facility] EMERGENCY DEPARTMENT ?Provider Note ? ? ?CSN: [id number] ?Arrival date & time: 09/10/21  6761 ? ?  ? ?History ? ?Chief Complaint  ?Patient presents with  ? Abdominal Pain  ? ? ?Sheila Mcdowell is a 66 y.o. female. ? ?The history is provided by the patient.  ?Abdominal Pain ?Pain location:  LLQ and RLQ ?Pain quality: aching, cramping and gnawing   ?Pain radiates to:  R flank ?Pain severity:  Moderate ?Onset quality:  Gradual ?Duration:  3 weeks ?Timing:  Constant ?Progression:  Worsening ?Chronicity:  New ?Worsened by:  Nothing ?Ineffective treatments:  NSAIDs and OTC medications ?Associated symptoms: anorexia   ?Associated symptoms: no constipation, no diarrhea, no dysuria, no fever, no hematuria, no nausea and no vomiting   ?Associated symptoms comment:  Urinary frequency ?Risk factors comment:  Prior history of renal stones, status post hysterectomy, appendectomy ? ?  ? ?Home Medications ?Prior to Admission medications   ?Medication Sig Start Date End Date Taking? Authorizing Provider  ?oxyCODONE-acetaminophen (PERCOCET/ROXICET) 5-325 MG tablet Take 1 tablet by mouth every 6 (six) hours as needed for severe pain. 09/10/21  Yes Blanchie Dessert, MD  ?tamsulosin (FLOMAX) 0.4 MG CAPS capsule Take 1 capsule (0.4 mg total) by mouth daily after supper. 09/10/21  Yes Blanchie Dessert, MD  ?alendronate (FOSAMAX) 70 MG tablet Take 70 mg by mouth once a week. Take with a full glass of water on an empty stomach.    [provider]  ?ASPIRIN 81 PO Take 81 mg by mouth daily.    [provider]  ?gabapentin (NEURONTIN) 300 MG capsule Take 300 mg by mouth 3 (three) times daily. 01/12/17   [provider]  ?metFORMIN (GLUCOPHAGE) 500 MG tablet Take 500 mg by mouth 2 (two) times daily.    [provider]  ?Multiple Vitamins-Minerals (MULTIVITAMIN PO) Take 1 capsule by mouth daily.    [provider]  ?omeprazole (PRILOSEC) 20 MG capsule Take 20 mg by mouth daily.     [provider]  ?ondansetron (ZOFRAN) 4 MG tablet Take 1 tablet (4 mg total) by mouth every 6 (six) hours as needed for nausea or vomiting. 10/02/20   Curatolo, Adam, DO  ?oxyCODONE (ROXICODONE) 5 MG immediate release tablet Take 1 tablet (5 mg total) by mouth every 6 (six) hours as needed for up to 20 doses for severe pain. 10/02/20   Curatolo, Adam, DO  ?rosuvastatin (CRESTOR) 10 MG tablet Take 10 mg by mouth daily. 02/22/17   [provider]  ?terbinafine (LAMISIL) 250 MG tablet Take 250 mg by mouth daily. 03/20/17   [provider]  ?topiramate (TOPAMAX) 50 MG tablet Take 50 mg by mouth daily. 03/23/17   [provider]  ?   ? ?Allergies    ?Meperidine   ? ?Review of Systems   ?Review of Systems  ?Constitutional:  Negative for fever.  ?Gastrointestinal:  Positive for abdominal pain and anorexia. Negative for constipation, diarrhea, nausea and vomiting.  ?Genitourinary:  Negative for dysuria and hematuria.  ? ?Physical Exam ?Updated Vital Signs ?BP 134/78   Pulse 62   Temp 98 ?F (36.7 ?C) (Oral)   Resp 17   SpO2 99%  ?Physical Exam ?Vitals and nursing note reviewed.  ?Constitutional:   ?   General: She is not in acute distress. ?   Appearance: She is well-developed.  ?HENT:  ?   Head: Normocephalic and atraumatic.  ?Eyes:  ?   Pupils: Pupils are equal, round, and reactive to light.  ?  Cardiovascular:  ?   Rate and Rhythm: Normal rate and regular rhythm.  ?   Heart sounds: Normal heart sounds. No murmur heard. ?  No friction rub.  ?Pulmonary:  ?   Effort: Pulmonary effort is normal.  ?   Breath sounds: Normal breath sounds. No wheezing or rales.  ?Abdominal:  ?   General: Bowel sounds are normal. There is no distension.  ?   Palpations: Abdomen is soft.  ?   Tenderness: There is abdominal tenderness in the right lower quadrant. There is right CVA tenderness and guarding. There is no rebound.  ?Musculoskeletal:     ?   General: No tenderness. Normal range of motion.  ?    Comments: No edema  ?Skin: ?   General: Skin is warm and dry.  ?   Findings: No rash.  ?Neurological:  ?   Mental Status: She is alert and oriented to person, place, and time.  ?   Cranial Nerves: No cranial nerve deficit.  ?Psychiatric:     ?   Behavior: Behavior normal.  ? ? ?ED Results / Procedures / Treatments   ?Labs ?(all labs ordered are listed, but only abnormal results are displayed) ?Labs Reviewed  ?COMPREHENSIVE METABOLIC PANEL - Abnormal; Notable for the following components:  ?    Result Value  ? CO2 19 (*)   ? Glucose, Bld 141 (*)   ? Creatinine, Ser 1.15 (*)   ? GFR, Estimated 53 (*)   ? All other components within normal limits  ?URINALYSIS, ROUTINE W REFLEX MICROSCOPIC - Abnormal; Notable for the following components:  ? Hgb urine dipstick TRACE (*)   ? Protein, ur 100 (*)   ? All other components within normal limits  ?URINALYSIS, MICROSCOPIC (REFLEX) - Abnormal; Notable for the following components:  ? Bacteria, UA FEW (*)   ? All other components within normal limits  ?CBC WITH DIFFERENTIAL/PLATELET  ? ? ?EKG ?None ? ?Radiology ?CT ABDOMEN PELVIS W CONTRAST ? ?Result Date: 09/10/2021 ?CLINICAL DATA:  Left lower quadrant pain.  Right lower quadrant pain EXAM: CT ABDOMEN AND PELVIS WITH CONTRAST TECHNIQUE: Multidetector CT imaging of the abdomen and pelvis was performed using the standard protocol following bolus administration of intravenous contrast. RADIATION DOSE REDUCTION: This exam was performed according to the departmental dose-optimization program which includes automated exposure control, adjustment of the mA and/or kV according to patient size and/or use of iterative reconstruction technique. CONTRAST:  160m OMNIPAQUE IOHEXOL 350 MG/ML SOLN COMPARISON:  Renal stone CT 10/02/2020 FINDINGS: Lower chest:  No acute finding. Hepatobiliary: Hepatic steatosis.No evidence of biliary obstruction or stone. Pancreas: Unremarkable. Spleen: Unremarkable. Adrenals/Urinary Tract: Right adrenal mass with  fairly homogeneous appearance, 3.6 cm in maximal span on axial images and stable since at least 2018, consistent with adenoma. Right hydronephrosis and perinephric stranding secondary to a lobulated stone at the UPJ which measures 7 mm in diameter. Three punctate left renal calculi, see coronal reformats. No acute bladder finding. There is herniation of the left aspect of the bladder through a left paramedian abdominal wall hernia. Stomach/Bowel:  No obstruction. No visible bowel inflammation Vascular/Lymphatic: Diffuse atheromatous plaque affecting the aorta. No mass or adenopathy. Reproductive:Hysterectomy. Other: No ascites or pneumoperitoneum. Musculoskeletal: No acute abnormalities. Lumbar spine degeneration with L4-5 and L5-S1 fusion. No bridging bone seen at L5-S1, although no hardware failure seen either. IMPRESSION: 1. Obstructing 7 mm stone at the right UPJ. 2. Punctate left renal calculi. 3. Long-standing right adrenal adenoma. 4. 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 Electronically Signed   By: Jorje Guild M.D.   On: 09/10/2021 10:37   ? ?Procedures ?Procedures  ? ? ?Medications Ordered in ED ?Medications  ?ondansetron (ZOFRAN) injection 4 mg (4 mg Intravenous Given 09/10/21 0912)  ?morphine (PF) 4 MG/ML injection 4 mg (4 mg Intravenous Given 09/10/21 0912)  ?lactated ringers bolus 1,000 mL (0 mLs Intravenous Stopped 09/10/21 1014)  ?iohexol (OMNIPAQUE) 350 MG/ML injection 100 mL (100 mLs Intravenous Contrast Given 09/10/21 1017)  ? ? ?ED Course/ Medical Decision Making/ A&P ?  ?                        ?Medical Decision Making ?Amount and/or Complexity of Data Reviewed ?External Data Reviewed: notes. ?Labs: ordered. Decision-making details documented in ED Course. ?Radiology: ordered and independent interpretation performed. Decision-making details documented in ED Course. ? ?Risk ?Prescription drug management. ? ? ?Patient is a pleasant 66 year old female presenting today with lower  abdominal pain that is been worsening over the last 3 weeks.  At first she reports it was mild and tolerable but now it is come to the point where it is not tolerable.  She has had poor appetite in the last week but den

## 2021-09-10 NOTE — ED Triage Notes (Signed)
Pt reports lower abdominal pain that "has been going on for weeks." Pt reports the pain radiates to her lower back and keeps her up at night.  ?

## 2021-09-10 NOTE — Discharge Instructions (Addendum)
You have a 7 mm kidney stone on the right side which is causing your pain.  You were given a prescription called Flomax to hopefully help it flushed and passed the stone but you should call urology today over by [facility] long so that they can follow-up with you early next week if the pain is not resolved. ?

## 2022-06-20 IMAGING — CT CT ABD-PELV W/ CM
2 of 5 series · 16 of 46 positions shown, 18 images · IV contrast (agent unspecified)
Comparison: Renal stone CT 10/02/2020

CLINICAL DATA: Left lower quadrant pain.  Right lower quadrant pain

EXAM:
CT ABDOMEN AND PELVIS WITH CONTRAST
TECHNIQUE: Multidetector CT imaging of the abdomen and pelvis was performed
using the standard protocol following bolus administration of
intravenous contrast.

[Series 2: axial st · axial · 0.90mm/px · z∈[-389,-14]mm · 13 of 85 slices shown, 15 images]
[im 5/85  soft-tissue]
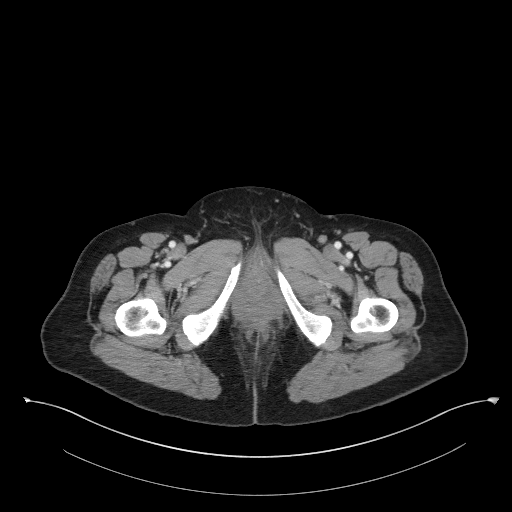
[im 5/85  bone]
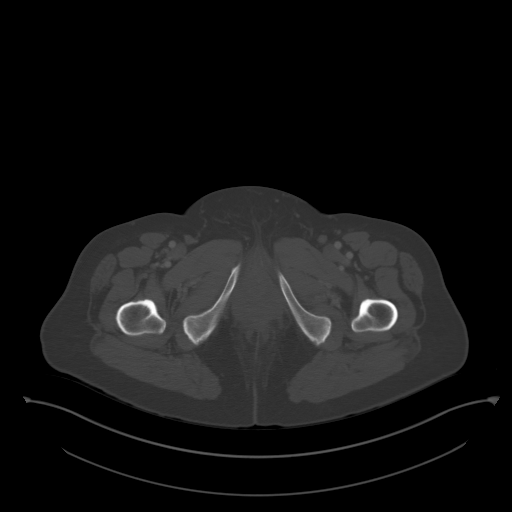
[im 14/85  soft-tissue]
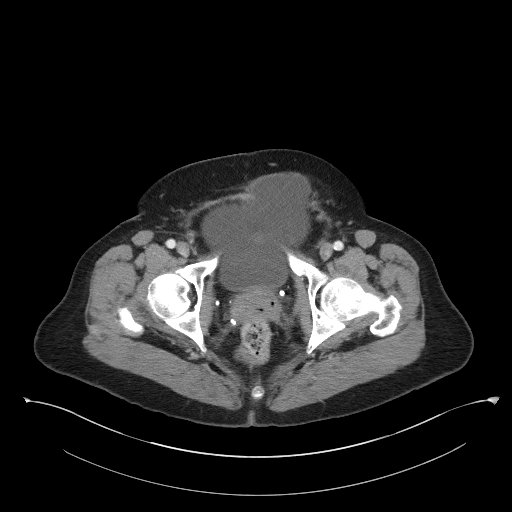
[im 18/85  soft-tissue]
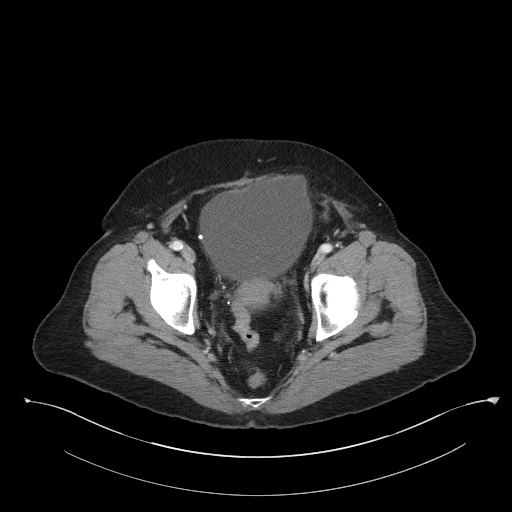
[im 23/85  soft-tissue]
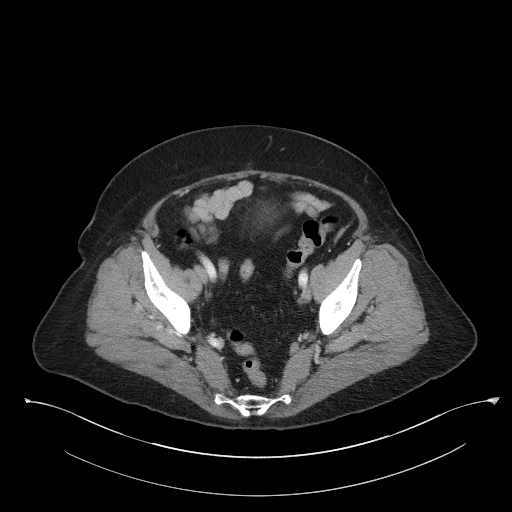
[im 31/85  soft-tissue]
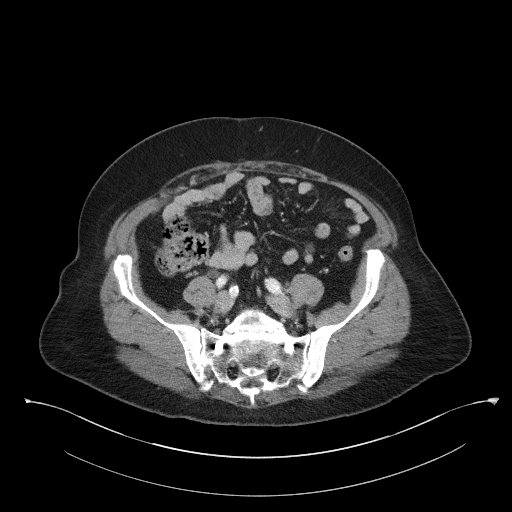
[im 36/85  soft-tissue]
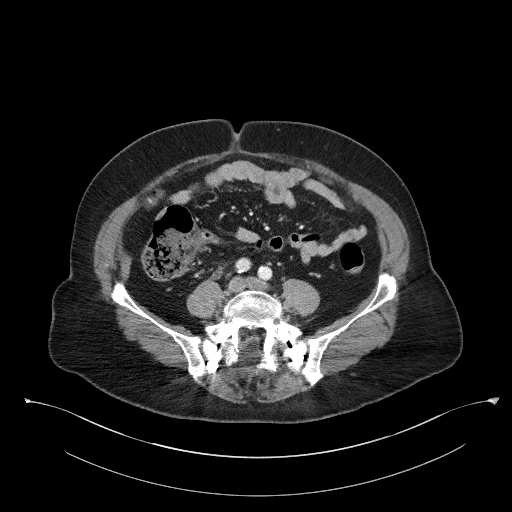
[im 45/85  soft-tissue]
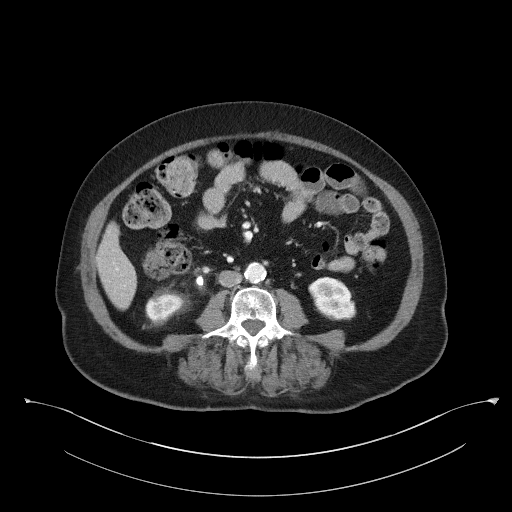
[im 49/85  soft-tissue]
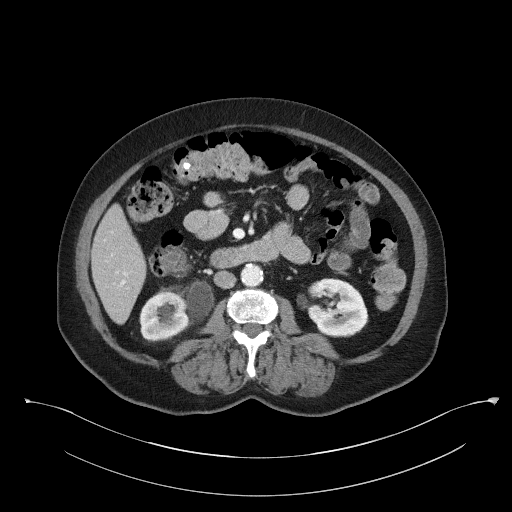
[im 54/85  soft-tissue]
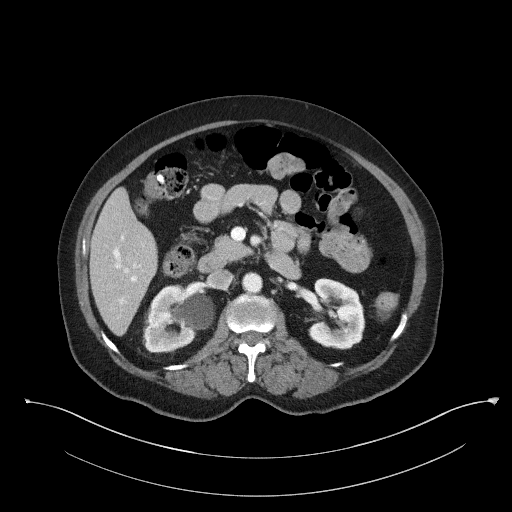
[im 54/85  bone]
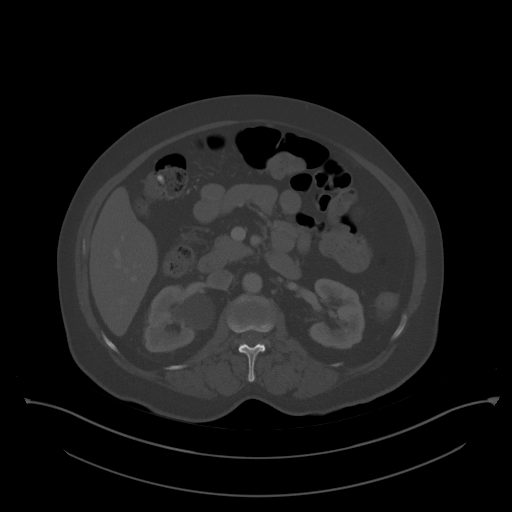
[im 62/85  soft-tissue]
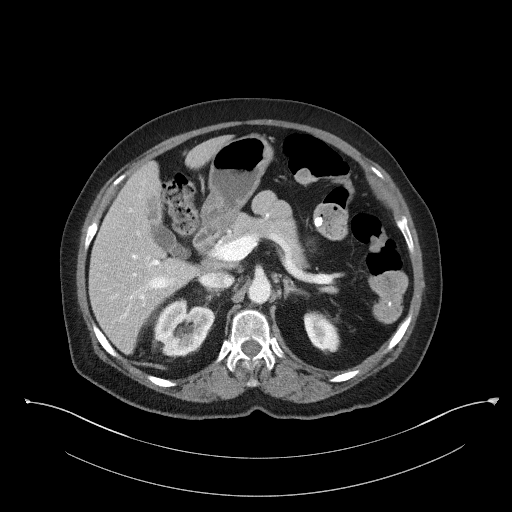
[im 67/85  soft-tissue]
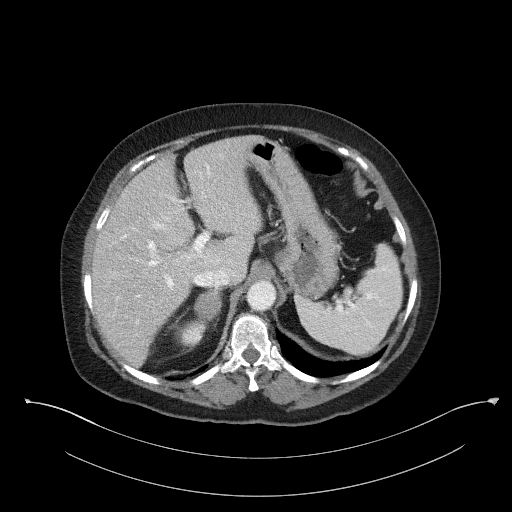
[im 71/85  soft-tissue]
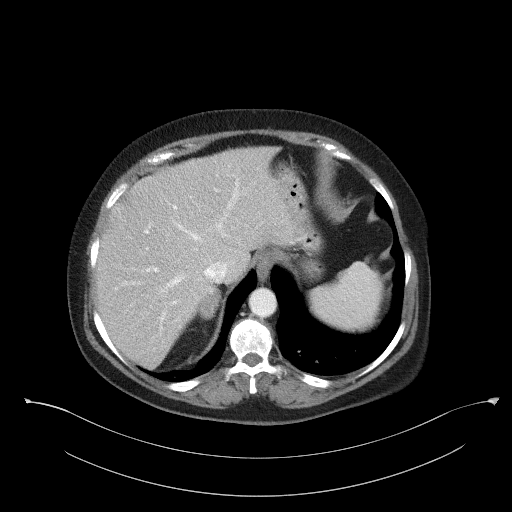
[im 80/85  soft-tissue]
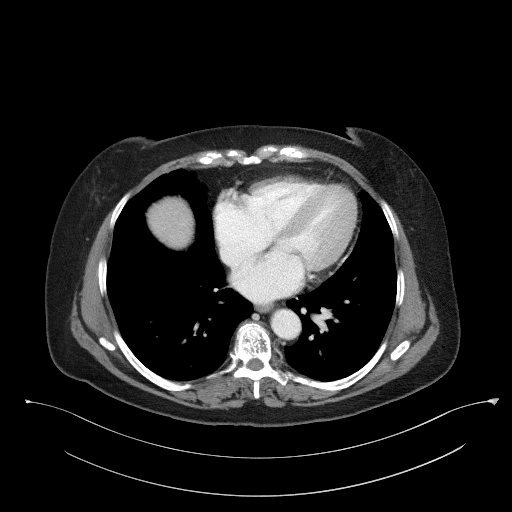

[Series 5: coronal st · coronal · 0.73mm/px · 3 of 105 slices shown]
[im 35/105  soft-tissue]
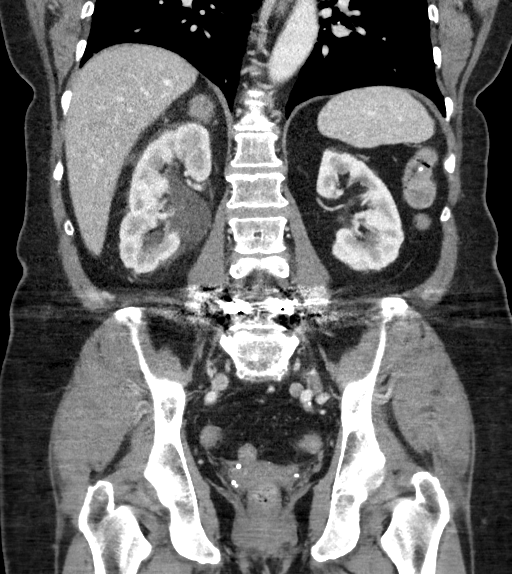
[im 47/105  soft-tissue]
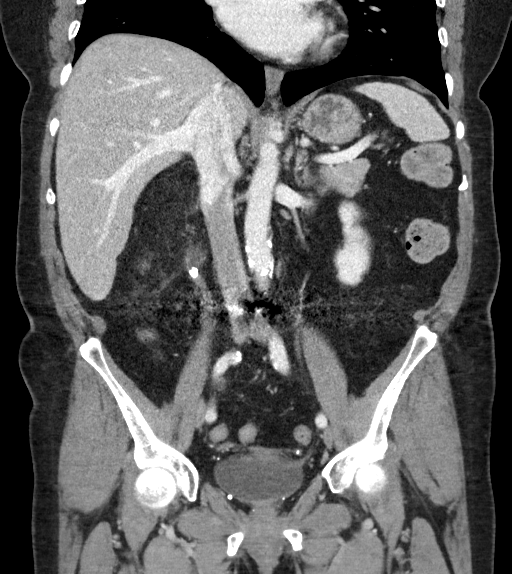
[im 58/105  soft-tissue]
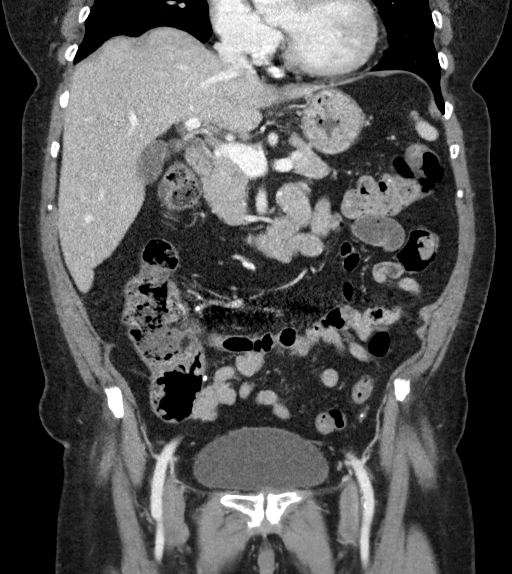

[16 of 46 positions shown; findings below may reference images not displayed]

RADIATION DOSE REDUCTION: This exam was performed according to the
departmental dose-optimization program which includes automated
exposure control, adjustment of the mA and/or kV according to
patient size and/or use of iterative reconstruction technique.

CONTRAST:  100mL OMNIPAQUE IOHEXOL 350 MG/ML SOLN
FINDINGS: Lower chest:  No acute finding.

Hepatobiliary: Hepatic steatosis.No evidence of biliary obstruction
or stone.

Pancreas: Unremarkable.

Spleen: Unremarkable.

Adrenals/Urinary Tract: Right adrenal mass with fairly homogeneous
appearance, 3.6 cm in maximal span on axial images and stable since
at least 2801, consistent with adenoma. Right hydronephrosis and
perinephric stranding secondary to a lobulated stone at the UPJ
which measures 7 mm in diameter. Three punctate left renal calculi,
see coronal reformats. No acute bladder finding. There is herniation
of the left aspect of the bladder through a left paramedian
abdominal wall hernia.

Stomach/Bowel:  No obstruction. No visible bowel inflammation

Vascular/Lymphatic: Diffuse atheromatous plaque affecting the aorta.
No mass or adenopathy.

Reproductive:Hysterectomy.

Other: No ascites or pneumoperitoneum.

Musculoskeletal: No acute abnormalities. Lumbar spine degeneration
with L4-5 and L5-S1 fusion. No bridging bone seen at L5-S1, although
no hardware failure seen either.
IMPRESSION: 1. Obstructing 7 mm stone at the right UPJ.
2. Punctate left renal calculi.
3. Long-standing right adrenal adenoma.
4. Chronic low left abdominal wall hernia containing bladder.

## 2022-09-27 ENCOUNTER — Other Ambulatory Visit (HOSPITAL_COMMUNITY): Payer: Self-pay | Admitting: Internal Medicine

## 2022-09-27 DIAGNOSIS — I2510 Atherosclerotic heart disease of native coronary artery without angina pectoris: Secondary | ICD-10-CM

## 2022-09-27 DIAGNOSIS — R079 Chest pain, unspecified: Secondary | ICD-10-CM

## 2022-09-28 ENCOUNTER — Other Ambulatory Visit (HOSPITAL_COMMUNITY): Payer: Medicare Other

## 2024-02-28 ENCOUNTER — Emergency Department (HOSPITAL_BASED_OUTPATIENT_CLINIC_OR_DEPARTMENT_OTHER)
Admission: EM | Admit: 2024-02-28 | Discharge: 2024-02-28 | Disposition: A | Discharge status: Home / Self Care | Attending: Emergency Medicine | Admitting: Emergency Medicine

## 2024-02-28 ENCOUNTER — Encounter (HOSPITAL_BASED_OUTPATIENT_CLINIC_OR_DEPARTMENT_OTHER): Payer: Self-pay | Admitting: Emergency Medicine

## 2024-02-28 ENCOUNTER — Emergency Department (HOSPITAL_BASED_OUTPATIENT_CLINIC_OR_DEPARTMENT_OTHER)

## 2024-02-28 ENCOUNTER — Other Ambulatory Visit: Payer: Self-pay

## 2024-02-28 DIAGNOSIS — R42 Dizziness and giddiness: Secondary | ICD-10-CM | POA: Diagnosis not present

## 2024-02-28 DIAGNOSIS — N179 Acute kidney failure, unspecified: Secondary | ICD-10-CM | POA: Diagnosis not present

## 2024-02-28 DIAGNOSIS — K5900 Constipation, unspecified: Secondary | ICD-10-CM | POA: Insufficient documentation

## 2024-02-28 DIAGNOSIS — E119 Type 2 diabetes mellitus without complications: Secondary | ICD-10-CM | POA: Insufficient documentation

## 2024-02-28 DIAGNOSIS — W1839XA Other fall on same level, initial encounter: Secondary | ICD-10-CM | POA: Insufficient documentation

## 2024-02-28 DIAGNOSIS — Z7982 Long term (current) use of aspirin: Secondary | ICD-10-CM | POA: Insufficient documentation

## 2024-02-28 DIAGNOSIS — Z87891 Personal history of nicotine dependence: Secondary | ICD-10-CM | POA: Diagnosis not present

## 2024-02-28 DIAGNOSIS — Z7984 Long term (current) use of oral hypoglycemic drugs: Secondary | ICD-10-CM | POA: Diagnosis not present

## 2024-02-28 LAB — COMPREHENSIVE METABOLIC PANEL WITH GFR
ALT: 19 U/L (ref 0–44)
AST: 37 U/L (ref 15–41)
Albumin: 4.6 g/dL (ref 3.5–5.0)
Alkaline Phosphatase: 111 U/L (ref 38–126)
Anion gap: 13 (ref 5–15)
BUN: 16 mg/dL (ref 8–23)
CO2: 21 mmol/L — ABNORMAL LOW (ref 22–32)
Calcium: 9.8 mg/dL (ref 8.9–10.3)
Chloride: 102 mmol/L (ref 98–111)
Creatinine, Ser: 1.86 mg/dL — ABNORMAL HIGH (ref 0.44–1.00)
GFR, Estimated: 29 mL/min — ABNORMAL LOW (ref >60)
Glucose, Bld: 115 mg/dL — ABNORMAL HIGH (ref 70–99)
Potassium: 3.9 mmol/L (ref 3.5–5.1)
Sodium: 136 mmol/L (ref 135–145)
Total Bilirubin: 0.5 mg/dL (ref 0.0–1.2)
Total Protein: 8 g/dL (ref 6.5–8.1)

## 2024-02-28 LAB — LIPASE, BLOOD: Lipase: 65 U/L — ABNORMAL HIGH (ref 11–51)

## 2024-02-28 LAB — CBC
HCT: 34.3 % — ABNORMAL LOW (ref 36.0–46.0)
Hemoglobin: 11.5 g/dL — ABNORMAL LOW (ref 12.0–15.0)
MCH: 28.2 pg (ref 26.0–34.0)
MCHC: 33.5 g/dL (ref 30.0–36.0)
MCV: 84.1 fL (ref 80.0–100.0)
Platelets: 219 K/uL (ref 150–400)
RBC: 4.08 MIL/uL (ref 3.87–5.11)
RDW: 14 % (ref 11.5–15.5)
WBC: 6.4 K/uL (ref 4.0–10.5)
nRBC: 0 % (ref 0.0–0.2)

## 2024-02-28 LAB — MAGNESIUM: Magnesium: 2.1 mg/dL (ref 1.7–2.4)

## 2024-02-28 MED ORDER — MECLIZINE HCL 25 MG PO TABS: 25.0000 mg | ORAL_TABLET | Freq: Three times a day (TID) | ORAL | 0 refills | Status: AC | PRN

## 2024-02-28 MED ORDER — SODIUM CHLORIDE 0.9 % IV BOLUS
500.0000 mL | Freq: Once | INTRAVENOUS | Status: AC
  Administered 2024-02-28: 500 mL via INTRAVENOUS

## 2024-02-28 MED ORDER — POLYETHYLENE GLYCOL 3350 17 G PO PACK: 17.0000 g | PACK | Freq: Every day | ORAL | 0 refills | Status: AC

## 2024-02-28 MED ORDER — ONDANSETRON 4 MG PO TBDP: 4.0000 mg | ORAL_TABLET | Freq: Three times a day (TID) | ORAL | 0 refills | Status: AC | PRN

## 2024-02-28 NOTE — ED Notes (Signed)
 Has not pooped in 10x days. Fell yesterday and struck her head. Was seeing spots before she fell, unsure of LOC.

## 2024-02-28 NOTE — ED Provider Notes (Addendum)
 [facility] Provider Note   CSN: [id number] Arrival date & time: 02/28/24  1348     Patient presents with: Constipation and Fall   Sheila Mcdowell is a 68 y.o. female.    Constipation Fall   68 year old female presents emergency department complaints of abdominal discomfort, constipation as well as a fall.  States that she has not had a bowel movement for the past 10 days.  States that she typically has 1 every day or every other day.  Has tried multiple oral medications as well as an enema without success and invoking bowel movement.  Does report nausea and vomiting for the past couple days 5-6 episodes.  States that she was able to tolerate a smoothie this morning.  States that she is still passing gas.  Reports prior abdominal surgeries including abdominal hysterectomy with bilateral salpingectomy, appendectomy, cesarean section.  Denies any fevers, chills, urinary symptoms, hematochezia/melena.  Also reports a fall that occurred yesterday.  States that she was walking into her door across her threshold.  States that she turned and developed abrupt onset room spinning dizziness.  States that she lowered herself to the ground landing on her bottom/left side.  Having no pain from this incident.  States that she sat there for about 10 minutes or so and the symptoms resolved and has been at baseline since then.  Does report history of peripheral vertigo states this feels somewhat similar.  Denies any blurry vision, double vision, weakness/sensory deficits in upper lower extremities, slurred speech, facial droop, gait abnormalities.  Has been asymptomatic since this episode.  Past medical history significant for migraine, diabetes, hyperlipidemia, osteoporosis, vitamin D deficiency  Prior to Admission medications   Medication Sig Start Date End Date Taking? Authorizing Provider  alendronate (FOSAMAX) 70 MG tablet Take 70 mg by mouth once a week. Take  with a full glass of water on an empty stomach.    [provider]  ASPIRIN 81 PO Take 81 mg by mouth daily.    [provider]  gabapentin (NEURONTIN) 300 MG capsule Take 300 mg by mouth 3 (three) times daily. 01/12/17   [provider]  metFORMIN (GLUCOPHAGE) 500 MG tablet Take 500 mg by mouth 2 (two) times daily.    [provider]  Multiple Vitamins-Minerals (MULTIVITAMIN PO) Take 1 capsule by mouth daily.    [provider]  omeprazole (PRILOSEC) 20 MG capsule Take 20 mg by mouth daily.    [provider]  ondansetron  (ZOFRAN ) 4 MG tablet Take 1 tablet (4 mg total) by mouth every 6 (six) hours as needed for nausea or vomiting. 10/02/20   Curatolo, Adam, DO  oxyCODONE  (ROXICODONE ) 5 MG immediate release tablet Take 1 tablet (5 mg total) by mouth every 6 (six) hours as needed for up to 20 doses for severe pain. 10/02/20   Curatolo, Adam, DO  oxyCODONE -acetaminophen  (PERCOCET/ROXICET) 5-325 MG tablet Take 1 tablet by mouth every 6 (six) hours as needed for severe pain. 09/10/21   Doretha Folks, MD  rosuvastatin (CRESTOR) 10 MG tablet Take 10 mg by mouth daily. 02/22/17   [provider]  tamsulosin  (FLOMAX ) 0.4 MG CAPS capsule Take 1 capsule (0.4 mg total) by mouth daily after supper. 09/10/21   Doretha Folks, MD  terbinafine (LAMISIL) 250 MG tablet Take 250 mg by mouth daily. 03/20/17   [provider]  topiramate (TOPAMAX) 50 MG tablet Take 50 mg by mouth daily. 03/23/17   [provider]  Allergies: Meperidine    Review of Systems  Gastrointestinal:  Positive for constipation.  All other systems reviewed and are negative.   Updated Vital Signs BP 125/73 (BP Location: Right Arm)   Pulse 75   Temp 97.6 F (36.4 C) (Oral)   Resp 16   Ht 4' 11 (1.499 m)   Wt 74.8 kg   SpO2 97%   BMI 33.33 kg/m   Physical Exam Vitals and nursing note reviewed.  Constitutional:      General: She is not in acute  distress.    Appearance: She is well-developed.  HENT:     Head: Normocephalic and atraumatic.  Eyes:     Conjunctiva/sclera: Conjunctivae normal.  Cardiovascular:     Rate and Rhythm: Normal rate and regular rhythm.     Heart sounds: No murmur heard. Pulmonary:     Effort: Pulmonary effort is normal. No respiratory distress.     Breath sounds: Normal breath sounds.  Abdominal:     Palpations: Abdomen is soft.     Tenderness: There is abdominal tenderness.     Comments: Mild generalized tenderness with some distention.  Musculoskeletal:        General: No swelling.     Cervical back: Neck supple.     Comments: No midline tenderness cervical, thoracic and lumbar spine without step-off or deformity.  No chest wall tenderness.  Full range of motion of bilateral upper lower extremities without overlying tenderness.  No appreciable traumatic injury on full body exam.  Skin:    General: Skin is warm and dry.     Capillary Refill: Capillary refill takes less than 2 seconds.  Neurological:     Mental Status: She is alert.     Comments: Alert and oriented to self, place, time and event.   Speech is fluent, clear without dysarthria or dysphasia.   Strength 5/5 in upper/lower extremities   Sensation intact in upper/lower extremities   Normal gait.  Negative Romberg. No pronator drift.  Normal finger-to-nose and feet tapping.  CN I not tested  CN II grossly intact visual fields bilaterally. Did not visualize posterior eye.  CN III, IV, VI PERRLA and EOMs intact bilaterally  CN V Intact sensation to sharp and light touch to the face  CN VII facial movements symmetric  CN VIII not tested  CN IX, X no uvula deviation, symmetric rise of soft palate  CN XI 5/5 SCM and trapezius strength bilaterally  CN XII Midline tongue protrusion, symmetric L/R movements     Psychiatric:        Mood and Affect: Mood normal.     (all labs ordered are listed, but only abnormal results are  displayed) Labs Reviewed  CBC - Abnormal; Notable for the following components:      Result Value   Hemoglobin 11.5 (*)    HCT 34.3 (*)    All other components within normal limits  COMPREHENSIVE METABOLIC PANEL WITH GFR  LIPASE, BLOOD  MAGNESIUM    EKG: None  Radiology: No results found.   Procedures   Medications Ordered in the ED  sodium chloride  0.9 % bolus 500 mL (500 mLs Intravenous New Bag/Given 02/28/24 1446)                                    Medical Decision Making Amount and/or Complexity of Data Reviewed Labs: ordered. Radiology: ordered.  Risk OTC drugs. Prescription  drug management.   This patient presents to the ED for concern of dizziness, abdominal pain, constipation, this involves an extensive number of treatment options, and is a complaint that carries with it a high risk of complications and morbidity.  The differential diagnosis includes SBO/LBO, volvulus, constipation, fecal impaction, vertigo/peripheral/central, other   Co morbidities that complicate the patient evaluation  See HPI   Additional history obtained:  Additional history obtained from EMR External records from outside source obtained and reviewed including hospital records   Lab Tests:  I Ordered, and personally interpreted labs.  The pertinent results include: No leukocytosis.  Anemia hemoglobin 11.5.  Platelets within range.  Mild recent bicarb of 21 otherwise electrolytes within limits.  No transaminitis.  Patient with AKI creatinine 1.86 from baseline around 1.1.  GFR of 29.  Lipase elevated at 65.   Imaging Studies ordered:  I ordered imaging studies including CT abdomen pelvis I independently visualized and interpreted imaging which showed atelectasis versus pneumonia bilateral lung bases.  Fatty infiltration of the liver.  Diffuse stool-filled colon.  Aortic atherosclerosis.  Anterior pelvic wall hernia containing bladder. I agree with the radiologist  interpretation  Cardiac Monitoring: / EKG:  The patient was maintained on a cardiac monitor.  I personally viewed and interpreted the cardiac monitored which showed an underlying rhythm of: Sinus rhythm   Consultations Obtained:  See ED course  Problem List / ED Course / Critical interventions / Medication management  Constipation I ordered medication including normal saline   Reevaluation of the patient after these medicines showed that the patient improved I have reviewed the patients home medicines and have made adjustments as needed   Social Determinants of Health:  Former cigarette use.  Denies illicit drug use.   Test / Admission - Considered:  Constipation Vitals signs within normal range and stable throughout visit. Laboratory/imaging studies significant for: See above 68 year old female presents emergency department complaints of abdominal discomfort, constipation as well as a fall.  States that she has not had a bowel movement for the past 10 days.  States that she typically has 1 every day or every other day.  Has tried multiple oral medications as well as an enema without success and invoking bowel movement.  Does report nausea and vomiting for the past couple days 5-6 episodes.  States that she was able to tolerate a smoothie this morning.  States that she is still passing gas.  Reports prior abdominal surgeries including abdominal hysterectomy with bilateral salpingectomy, appendectomy, cesarean section.  Denies any fevers, chills, urinary symptoms, hematochezia/melena.  Also reports a fall that occurred yesterday.  States that she was walking into her door across her threshold.  States that she turned and developed abrupt onset room spinning dizziness.  States that she lowered herself to the ground landing on her bottom/left side.  Having no pain from this incident.  States that she sat there for about 10 minutes or so and the symptoms resolved and has been at baseline  since then.  Does report history of peripheral vertigo states this feels somewhat similar.  Denies any blurry vision, double vision, weakness/sensory deficits in upper lower extremities, slurred speech, facial droop, gait abnormalities.  Has been asymptomatic since this episode. On exam, generalized abdominal tenderness as well as mild distention appreciated.  Nonfocal neurologic exam including cerebellar testing.  Workup today, labs concerning for AKI creatinine elevation 1.8 from baseline around 1.1-1.2; patient was treated with IV fluids while in emergency department.  Otherwise, labs without  acute emergent process.  CT imaging concerning for constipation, possible pneumonia as well as other incidental findings as above.  Patient without any respiratory symptoms at all.  No cough, congestion, fever; low suspicion clinically for pneumonia.  Recommended admission given patient's AKI most likely from GI loss given p.o. intolerance and emesis over the past few days, the patient would prefer to have her kidney function checked again in the outpatient setting.  States she is she is feeling better after medications were administered.  Will trial MiraLAX  titrated to effect, dietary/lifestyle changes for treatment of constipation.  Patient educated regarding follow-up with primary care regarding additional incidental findings on CT imaging.  Regarding dizziness, seems vertiginous in nature.  Patient does have a history of vertigo.  Not currently symptomatic at this time with nonfocal neurologic exam.  Seems peripheral in nature.  Will send home with meclizine  to use as needed and recommend follow-up with ENT.  Low suspicion for central etiology.  Treatment plan discussed with patient and she acknowledged understanding was agreeable.  Patient well-appearing, afebrile in no acute distress upon discharge. Worrisome signs and symptoms were discussed with the patient, and the patient acknowledged understanding to return to  the ED if noticed. Patient was stable upon discharge.       Final diagnoses:  None    ED Discharge Orders     None          Silver Wonda LABOR, [place] 02/28/24 2314    Silver Wonda LABOR, PA 02/28/24 7684    Lenor Hollering, MD 02/28/24 [phone]

## 2024-02-28 NOTE — ED Triage Notes (Signed)
 Pt reports constipation x 10d; also concerned because she fell x 2 yesterday; denies injuries, but is concerned about the falling

## 2024-02-28 NOTE — Discharge Instructions (Addendum)
 As discussed, we will send you home with medication, polyethylene glycol to take for your constipation.  Recommend beginning with 2 to 4 packets.  You may increase if you do not have significant bowel movement or decrease if you develop diarrhea.  Also recommend beginning a fiber supplement daily.  You may find over-the-counter at the pharmacy.  Recommend continued oral hydration at home via electrolyte rich fluids such as Pedialyte, sugar-free Gatorade, liquid IV, Body Armor.  Will send you home with medication to use as needed for nausea.  Regarding the room spinning dizziness that was abrupt onset and quickly went away, I suspect that this is most likely related to peripheral cause of vertigo.  We do not think that you had a stroke.  Recommend follow-up with ear nose and throat specialist in the outpatient setting regarding this.  Your blood test showed slight AKI, kidney injury most likely from dehydration.  Recommend follow-up with your primary care to have this retested.

## 2024-03-09 ENCOUNTER — Emergency Department (HOSPITAL_BASED_OUTPATIENT_CLINIC_OR_DEPARTMENT_OTHER)

## 2024-03-09 ENCOUNTER — Emergency Department (HOSPITAL_BASED_OUTPATIENT_CLINIC_OR_DEPARTMENT_OTHER)
Admission: EM | Admit: 2024-03-09 | Discharge: 2024-03-09 | Disposition: A | Discharge status: Home / Self Care | Attending: Emergency Medicine | Admitting: Emergency Medicine

## 2024-03-09 ENCOUNTER — Other Ambulatory Visit: Payer: Self-pay

## 2024-03-09 ENCOUNTER — Encounter (HOSPITAL_BASED_OUTPATIENT_CLINIC_OR_DEPARTMENT_OTHER): Payer: Self-pay | Admitting: Emergency Medicine

## 2024-03-09 DIAGNOSIS — Z7982 Long term (current) use of aspirin: Secondary | ICD-10-CM | POA: Insufficient documentation

## 2024-03-09 DIAGNOSIS — A599 Trichomoniasis, unspecified: Secondary | ICD-10-CM | POA: Insufficient documentation

## 2024-03-09 DIAGNOSIS — N201 Calculus of ureter: Secondary | ICD-10-CM | POA: Insufficient documentation

## 2024-03-09 DIAGNOSIS — K5900 Constipation, unspecified: Secondary | ICD-10-CM | POA: Diagnosis present

## 2024-03-09 LAB — CBC WITH DIFFERENTIAL/PLATELET
Abs Immature Granulocytes: 0.03 K/uL (ref 0.00–0.07)
Basophils Absolute: 0.1 K/uL (ref 0.0–0.1)
Basophils Relative: 1 %
Eosinophils Absolute: 0.2 K/uL (ref 0.0–0.5)
Eosinophils Relative: 3 %
HCT: 35.3 % — ABNORMAL LOW (ref 36.0–46.0)
Hemoglobin: 11.6 g/dL — ABNORMAL LOW (ref 12.0–15.0)
Immature Granulocytes: 0 %
Lymphocytes Relative: 35 %
Lymphs Abs: 2.3 K/uL (ref 0.7–4.0)
MCH: 27.9 pg (ref 26.0–34.0)
MCHC: 32.9 g/dL (ref 30.0–36.0)
MCV: 84.9 fL (ref 80.0–100.0)
Monocytes Absolute: 0.6 K/uL (ref 0.1–1.0)
Monocytes Relative: 8 %
Neutro Abs: 3.6 K/uL (ref 1.7–7.7)
Neutrophils Relative %: 53 %
Platelets: 239 K/uL (ref 150–400)
RBC: 4.16 MIL/uL (ref 3.87–5.11)
RDW: 14.5 % (ref 11.5–15.5)
WBC: 6.8 K/uL (ref 4.0–10.5)
nRBC: 0 % (ref 0.0–0.2)

## 2024-03-09 LAB — URINALYSIS, MICROSCOPIC (REFLEX)

## 2024-03-09 LAB — COMPREHENSIVE METABOLIC PANEL WITH GFR
ALT: 17 U/L (ref 0–44)
AST: 33 U/L (ref 15–41)
Albumin: 4.2 g/dL (ref 3.5–5.0)
Alkaline Phosphatase: 118 U/L (ref 38–126)
Anion gap: 14 (ref 5–15)
BUN: 14 mg/dL (ref 8–23)
CO2: 22 mmol/L (ref 22–32)
Calcium: 9.2 mg/dL (ref 8.9–10.3)
Chloride: 102 mmol/L (ref 98–111)
Creatinine, Ser: 1.81 mg/dL — ABNORMAL HIGH (ref 0.44–1.00)
GFR, Estimated: 30 mL/min — ABNORMAL LOW (ref >60)
Glucose, Bld: 110 mg/dL — ABNORMAL HIGH (ref 70–99)
Potassium: 4.1 mmol/L (ref 3.5–5.1)
Sodium: 137 mmol/L (ref 135–145)
Total Bilirubin: 0.5 mg/dL (ref 0.0–1.2)
Total Protein: 7.9 g/dL (ref 6.5–8.1)

## 2024-03-09 LAB — URINALYSIS, ROUTINE W REFLEX MICROSCOPIC
Bilirubin Urine: NEGATIVE
Glucose, UA: NEGATIVE mg/dL
Ketones, ur: NEGATIVE mg/dL
Nitrite: NEGATIVE
Protein, ur: >=300 mg/dL — AB
Specific Gravity, Urine: 1.02 (ref 1.005–1.030)
pH: 6 (ref 5.0–8.0)

## 2024-03-09 LAB — LIPASE, BLOOD: Lipase: 40 U/L (ref 11–51)

## 2024-03-09 MED ORDER — LACTULOSE 10 GM/15ML PO SOLN
20.0000 g | Freq: Once | ORAL | Status: AC
  Administered 2024-03-09: 20 g via ORAL
  Filled 2024-03-09: qty 30

## 2024-03-09 MED ORDER — METRONIDAZOLE 500 MG PO TABS: 500.0000 mg | ORAL_TABLET | Freq: Two times a day (BID) | ORAL | 0 refills | Status: AC

## 2024-03-09 MED ORDER — LACTULOSE 20 GM/30ML PO SOLN: 20.0000 g | Freq: Every day | ORAL | 0 refills | Status: AC

## 2024-03-09 NOTE — ED Notes (Signed)
 1 BM noted. More liquid and not very much relief. Patient ambulatory to and from restroom.

## 2024-03-09 NOTE — ED Notes (Signed)
 1 Soap Suds Enema administered by this [employer] fundraiser. Tolerated well.

## 2024-03-09 NOTE — Discharge Instructions (Addendum)
 Please follow up with your primary care doctor within 2-3 days. For constipation we also recommend a diet high in fiber (beans, fruits, vegetables, whole grains). Take Colace 100-200 mg up to three times per day. You may take along with Senokot 1-2 tabs, ingest with full glass of water.  You may also take MiraLAX  2 capfuls 2 times a day until stools become soft and then slowly decrease the amount of MiraLAX  used.  Maintain fluid intake 6-8 glasses per day. Please increase fibers in your diet. You may also take Milk of Magnesia 30 mL as needed for constipation, you may repeat in 2 hours again if no bowl movement.  I given you prescription for lactulose.  Take daily to help with bowel movements.  Your urine did show evidence of trichomonas which is an STD.  I have given you antibiotics.  Do not drink alcohol while taking this medication or for 48 hours afterwards as it can make you sick.  Make sure to follow-up and let your partner know so they may be treated as well  Your CT scan did also show a kidney stone on the right side.  As it starts to move you may develop some pain.  Return for new or worsening symptoms

## 2024-03-09 NOTE — ED Notes (Signed)
Called lab to add on urine culture to previously collected urine 

## 2024-03-09 NOTE — ED Triage Notes (Signed)
 ePt returns d/t constipation; was seen here 9/24 for same; reports no BM despite multiple treatments at home

## 2024-03-09 NOTE — ED Provider Notes (Signed)
 [place] [facility] Provider Note   CSN: [id number] Arrival date & time: 03/09/24  1529    Patient presents with: Constipation   Sheila Mcdowell is a 68 y.o. female here for ration of constipation.  Has had ongoing constipation over the last few weeks.  Was seen here 02/24/2024 had CT scan at that time.  She has been doing a capful of MiraLAX , Linzess and enemas at home without relief.  She is passing flatus.  She feels like her abdomen is distended.  No fever, nausea, vomiting, back pain, dysuria, hematuria, melena, bright red blood per rectum   HPI     Prior to Admission medications   Medication Sig Start Date End Date Taking? Authorizing Provider  Lactulose 20 GM/30ML SOLN Take 30 mLs (20 g total) by mouth daily. 03/09/24  Yes Teddy Rebstock A, PA-C  metroNIDAZOLE (FLAGYL) 500 MG tablet Take 1 tablet (500 mg total) by mouth 2 (two) times daily. 03/09/24  Yes Mahek Schlesinger A, PA-C  alendronate (FOSAMAX) 70 MG tablet Take 70 mg by mouth once a week. Take with a full glass of water on an empty stomach.    [provider]  ASPIRIN 81 PO Take 81 mg by mouth daily.    [provider]  gabapentin (NEURONTIN) 300 MG capsule Take 300 mg by mouth 3 (three) times daily. 01/12/17   [provider]  meclizine  (ANTIVERT ) 25 MG tablet Take 1 tablet (25 mg total) by mouth 3 (three) times daily as needed for dizziness. 02/28/24   Silver Wonda LABOR, PA  metFORMIN (GLUCOPHAGE) 500 MG tablet Take 500 mg by mouth 2 (two) times daily.    [provider]  Multiple Vitamins-Minerals (MULTIVITAMIN PO) Take 1 capsule by mouth daily.    [provider]  omeprazole (PRILOSEC) 20 MG capsule Take 20 mg by mouth daily.    [provider]  ondansetron  (ZOFRAN ) 4 MG tablet Take 1 tablet (4 mg total) by mouth every 6 (six) hours as needed for nausea or vomiting. 10/02/20   Curatolo, Adam, DO  ondansetron  (ZOFRAN -ODT) 4 MG  disintegrating tablet Take 1 tablet (4 mg total) by mouth every 8 (eight) hours as needed. 02/28/24   Silver Wonda LABOR, PA  oxyCODONE  (ROXICODONE ) 5 MG immediate release tablet Take 1 tablet (5 mg total) by mouth every 6 (six) hours as needed for up to 20 doses for severe pain. 10/02/20   Curatolo, Adam, DO  oxyCODONE -acetaminophen  (PERCOCET/ROXICET) 5-325 MG tablet Take 1 tablet by mouth every 6 (six) hours as needed for severe pain. 09/10/21   Doretha Folks, MD  polyethylene glycol (MIRALAX ) 17 g packet Take 17 g by mouth daily. 02/28/24   Silver Wonda LABOR, PA  rosuvastatin (CRESTOR) 10 MG tablet Take 10 mg by mouth daily. 02/22/17   [provider]  tamsulosin  (FLOMAX ) 0.4 MG CAPS capsule Take 1 capsule (0.4 mg total) by mouth daily after supper. 09/10/21   Doretha Folks, MD  terbinafine (LAMISIL) 250 MG tablet Take 250 mg by mouth daily. 03/20/17   [provider]  topiramate (TOPAMAX) 50 MG tablet Take 50 mg by mouth daily. 03/23/17   [provider]    Allergies: Meperidine    Review of Systems  Constitutional: Negative.   HENT: Negative.    Respiratory: Negative.    Cardiovascular: Negative.   Gastrointestinal:  Positive for abdominal pain and constipation. Negative for abdominal distention, anal bleeding, blood in stool, diarrhea, nausea, rectal pain and vomiting.  Genitourinary: Negative.   Musculoskeletal: Negative.   Skin: Negative.   Neurological: Negative.   All other systems reviewed and are negative.   Updated Vital Signs BP 127/80 (BP Location: Right Arm)   Pulse 87   Temp (!) 97.5 F (36.4 C) (Oral)   Resp 18   Ht 4' 11 (1.499 m)   Wt 74.8 kg   SpO2 96%   BMI 33.33 kg/m   Physical Exam Vitals and nursing note reviewed.  Constitutional:      General: She is not in acute distress.    Appearance: She is well-developed. She is not ill-appearing, toxic-appearing or diaphoretic.  HENT:     Head: Atraumatic.  Eyes:     Pupils:  Pupils are equal, round, and reactive to light.  Cardiovascular:     Rate and Rhythm: Normal rate.     Pulses: Normal pulses.     Heart sounds: Normal heart sounds.  Pulmonary:     Effort: Pulmonary effort is normal. No respiratory distress.     Breath sounds: Normal breath sounds.  Abdominal:     General: Bowel sounds are normal. There is distension.     Palpations: Abdomen is soft.     Tenderness: There is abdominal tenderness. There is no right CVA tenderness, left CVA tenderness, guarding or rebound.     Comments: Soft, diffusely tender, mildly distended, negative fluid wave.  Musculoskeletal:        General: Normal range of motion.     Cervical back: Normal range of motion.  Skin:    General: Skin is warm and dry.     Capillary Refill: Capillary refill takes less than 2 seconds.  Neurological:     General: No focal deficit present.     Mental Status: She is alert.  Psychiatric:        Mood and Affect: Mood normal.     (all labs ordered are listed, but only abnormal results are displayed) Labs Reviewed  CBC WITH DIFFERENTIAL/PLATELET - Abnormal; Notable for the following components:      Result Value   Hemoglobin 11.6 (*)    HCT 35.3 (*)    All other components within normal limits  COMPREHENSIVE METABOLIC PANEL WITH GFR - Abnormal; Notable for the following components:   Glucose, Bld 110 (*)    Creatinine, Ser 1.81 (*)    GFR, Estimated 30 (*)    All other components within normal limits  URINALYSIS, ROUTINE W REFLEX MICROSCOPIC - Abnormal; Notable for the following components:   APPearance HAZY (*)    Hgb urine dipstick SMALL (*)    Protein, ur >=300 (*)    Leukocytes,Ua MODERATE (*)    All other components within normal limits  URINALYSIS, MICROSCOPIC (REFLEX) - Abnormal; Notable for the following components:   Bacteria, UA FEW (*)    Trichomonas, UA PRESENT (*)    All other components within normal limits  URINE CULTURE  LIPASE, BLOOD     EKG: None  Radiology: CT ABDOMEN PELVIS WO CONTRAST Result Date: 03/09/2024 CLINICAL DATA:  Abdominal pain, acute, nonlocalized. Constipation. Clinical concern for small bowel obstruction. EXAM: CT ABDOMEN AND PELVIS WITHOUT CONTRAST TECHNIQUE: Multidetector CT imaging of the abdomen and pelvis was performed following the standard protocol without IV contrast. RADIATION DOSE REDUCTION: This exam was performed according to the departmental dose-optimization program which includes automated exposure control, adjustment of the mA and/or kV according to patient size and/or use of iterative reconstruction technique. COMPARISON:  02/28/2024. FINDINGS: Lower chest:  The heart is enlarged and multi-vessel coronary artery calcifications are noted. Strandy atelectasis or infiltrate is noted at the lung bases, similar in appearance to the prior exam. Hepatobiliary: No focal abnormality in the liver. Fatty infiltration of the liver is noted. The gallbladder is without stones. No biliary ductal dilatation. Pancreas: Unremarkable. No pancreatic ductal dilatation or surrounding inflammatory changes. Spleen: Normal in size without focal abnormality. Adrenals/Urinary Tract: The left adrenal gland is within normal limits. There is a stable right adrenal nodule measuring 3.5 cm with attenuation of 32 Hounsfield units, unchanged from 2018. No renal calculus or hydronephrosis bilaterally. No bladder wall thickening. There is a ventral abdominal wall hernia containing a portion of the urinary bladder on the left. A 2 x 3 mm calculus is noted in the region of the distal left ureter which is new from the previous exam, suggesting ureteral calculus, axial image 67. Stomach/Bowel: The stomach is within normal limits. A large amount of stool is noted in the colon. No bowel obstruction, free air, or pneumatosis is seen. Appendix is surgically absent. Vascular/Lymphatic: Aortic atherosclerosis. No enlarged abdominal or pelvic lymph  nodes. Reproductive: Status post hysterectomy. No adnexal masses. Other: No abdominopelvic ascites. Musculoskeletal: Degenerative changes are present in the thoracolumbar spine. Spinal fusion hardware is present from L4-S1. 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 IMPRESSION: 1. Large amount of stool in the colon, compatible with constipation. No small bowel obstruction is seen. 2. 2 x 3 mm calculus in the distal right ureter in the pelvis without evidence of obstructive uropathy. 3. Low anterior abdominal wall hernia on the left containing a portion of the urinary bladder. 4. Strandy atelectasis or infiltrate at the lung bases, unchanged from the previous exam. 5. Hepatic steatosis. 6. Stable right adrenal nodule, unchanged from 2018 and likely benign. 7. Aortic atherosclerosis and coronary artery calcifications. Electronically Signed   By: Leita Birmingham M.D.   On: 03/09/2024 17:28     Procedures   Medications Ordered in the ED  lactulose (CHRONULAC) 10 GM/15ML solution 20 g (20 g Oral Given 03/09/24 4591)    68 year old here for evaluation of constipation.  Seen previously for similar.  Doing medications at home without relief.  She is passing flatus.  She feels like her abdomen is distended.  Heart lungs clear.  Abdomen soft, diffusely tender, mild distention.  Will plan on labs, imaging, reassess  Labs and imaging personally viewed and interpreted:  CBC without leukocytosis Metabolic panel creatinine 1.81, similar to prior Lipase 40 UA moderate leuks, few bacteria-trichomonas present CT abdomen pelvis shows constipation and 2mm distal right ureter stone without obstructing process  Discussed results with patient.  Will give lactulose, soapsuds enema, reassess  Patient reassessed.  Bowel movement here.  Discussed results with patient.  UA positive for trichomonas.  She will be treated with Flagyl.  Discussed no EtOH use while using this medication.  She will inform her  partners.  She does not desire any other STD testing.  She is tolerating p.o. intake.  Will do lactulose at home.  Will have her return for new or worsening symptoms. Kidney stone had also be causing her pain however pain is more diffuse in nature than to her right lower quadrant.  She does not want anything for pain medicine at this time.  Patient is nontoxic, nonseptic appearing, in no apparent distress.  Patient's pain and other symptoms adequately managed in emergency department.  Fluid bolus given.  Labs, imaging and vitals reviewed.  Patient does not meet the SIRS or Sepsis criteria.  On repeat exam patient does not have a surgical abdomin and there are no peritoneal signs.  No indication of appendicitis, bowel obstruction, bowel perforation, cholecystitis, diverticulitis, PID, intermittent, persistent torsion, TOA, ectopic pregnancy, AAA, dissection, traumatic injury.  Patient discharged home with symptomatic treatment and given strict instructions for follow-up with their primary care physician.  I have also discussed reasons to return immediately to the ER.  Patient expresses understanding and agrees with plan.                                    Medical Decision Making Amount and/or Complexity of Data Reviewed External Data Reviewed: labs, radiology and notes. Labs: ordered. Decision-making details documented in ED Course. Radiology: ordered and independent interpretation performed. Decision-making details documented in ED Course.  Risk OTC drugs. Prescription drug management. Decision regarding hospitalization. Diagnosis or treatment significantly limited by social determinants of health.       Final diagnoses:  Constipation, unspecified constipation type  Ureteral stone  Trichomonas infection    ED Discharge Orders          Ordered    Lactulose 20 GM/30ML SOLN  Daily        03/09/24 1935    metroNIDAZOLE (FLAGYL) 500 MG tablet  2 times daily        03/09/24 1935                Loucille Takach A, PA-C 03/09/24 1937    Elnor Jayson LABOR, DO 03/13/24 1610

## 2024-03-10 LAB — URINE CULTURE: Culture: <10000 — AB

## 2024-04-07 ENCOUNTER — Encounter (HOSPITAL_BASED_OUTPATIENT_CLINIC_OR_DEPARTMENT_OTHER): Payer: Self-pay

## 2024-04-07 ENCOUNTER — Emergency Department (HOSPITAL_BASED_OUTPATIENT_CLINIC_OR_DEPARTMENT_OTHER)
Admission: EM | Admit: 2024-04-07 | Discharge: 2024-04-07 | Disposition: A | Discharge status: Home / Self Care | Attending: Emergency Medicine | Admitting: Emergency Medicine

## 2024-04-07 ENCOUNTER — Other Ambulatory Visit: Payer: Self-pay

## 2024-04-07 DIAGNOSIS — Z7982 Long term (current) use of aspirin: Secondary | ICD-10-CM | POA: Diagnosis not present

## 2024-04-07 DIAGNOSIS — M5410 Radiculopathy, site unspecified: Secondary | ICD-10-CM | POA: Insufficient documentation

## 2024-04-07 DIAGNOSIS — Z7984 Long term (current) use of oral hypoglycemic drugs: Secondary | ICD-10-CM | POA: Diagnosis not present

## 2024-04-07 DIAGNOSIS — E119 Type 2 diabetes mellitus without complications: Secondary | ICD-10-CM | POA: Diagnosis not present

## 2024-04-07 DIAGNOSIS — M79604 Pain in right leg: Secondary | ICD-10-CM

## 2024-04-07 MED ORDER — METHYLPREDNISOLONE 4 MG PO TBPK: ORAL_TABLET | ORAL | 0 refills | Status: AC

## 2024-04-07 MED ORDER — LIDOCAINE 5 % EX PTCH: 1.0000 | MEDICATED_PATCH | CUTANEOUS | 0 refills | Status: AC

## 2024-04-07 NOTE — ED Triage Notes (Signed)
 Reports R leg pain from ankle to hip for 3 weeks. Ambulatory w cane. Denies known injury. No obvious swelling, redness noted

## 2024-04-07 NOTE — ED Notes (Signed)
 Pt alert and oriented X 4 at the time of discharge. RR even and unlabored. No acute distress noted. Pt verbalized understanding of discharge instructions as discussed. Pt ambulatory to lobby at time of discharge.

## 2024-04-07 NOTE — ED Provider Notes (Signed)
 [place] [facility] [place] Provider [place]   CSN: [id number] Arrival date & time: 04/07/24  9068     Patient presents with: No chief complaint on file.   Laiyah Exline is a 68 y.o. female.  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  Not having dizziness.  Does not know if it is weak. Also feels like it is numb on that side. Has not felt any better.  No back pain, just lower abdomen and right leg. When stand pain radiates to abdomen but otherwise not having abdominal pain or chest pain.  Did find kidney stone on CT .  No loss of control of bowel or bladder. No fevers.  Is on aspirin.   2 days ago had fall after taking dog out, hit side of face, not having headache now.  No falls or trauma prior to the leg pain .  Constipation has improved. After eating meats will feel some nausea.    Past Medical History:  Diagnosis Date   Diabetes (HCC)    Heart murmur    Hyperlipemia    Migraine    Osteoporosis    Vitamin D deficiency     Past Surgical History:  Procedure Laterality Date   ABDOMINAL HYSTERECTOMY     APPENDECTOMY     BACK SURGERY     CARPAL TUNNEL RELEASE Bilateral    CESAREAN SECTION     x 2   SHOULDER SURGERY Right    TUBAL LIGATION      Prior to Admission medications   Medication Sig Start Date End Date Taking? Authorizing Provider   alendronate (FOSAMAX) 70 MG tablet Take 70 mg by mouth once a week. Take with a full glass of water on an empty stomach.    [provider]  ASPIRIN 81 PO Take 81 mg by mouth daily.    [provider]  gabapentin (NEURONTIN) 300 MG capsule Take 300 mg by mouth 3 (three) times daily. 01/12/17   [provider]  Lactulose 20 GM/30ML SOLN Take 30 mLs (20 g total) by mouth daily. 03/09/24   Henderly, Britni A, PA-C  meclizine  (ANTIVERT ) 25 MG tablet Take 1 tablet (25 mg total) by mouth 3 (three) times daily as needed for dizziness. 02/28/24   Silver Wonda LABOR, PA  metFORMIN (GLUCOPHAGE) 500 MG tablet Take 500 mg by mouth 2 (two) times daily.    [provider]  metroNIDAZOLE (FLAGYL) 500 MG tablet Take 1 tablet (500 mg total) by mouth 2 (two) times daily. 03/09/24   Henderly, Britni A, PA-C  Multiple Vitamins-Minerals (MULTIVITAMIN PO) Take 1 capsule by mouth daily.    [provider]  omeprazole (PRILOSEC) 20 MG capsule Take 20 mg by mouth daily.    [provider]  ondansetron  (ZOFRAN ) 4 MG tablet Take 1 tablet (4 mg total) by mouth every 6 (six) hours as needed for nausea or  vomiting. 10/02/20   Curatolo, Adam, DO  ondansetron  (ZOFRAN -ODT) 4 MG disintegrating tablet Take 1 tablet (4 mg total) by mouth every 8 (eight) hours as needed. 02/28/24   Silver Wonda LABOR, PA  oxyCODONE  (ROXICODONE ) 5 MG immediate release tablet Take 1 tablet (5 mg total) by mouth every 6 (six) hours as needed for up to 20 doses for severe pain. 10/02/20   Curatolo, Adam, DO  oxyCODONE -acetaminophen  (PERCOCET/ROXICET) 5-325 MG tablet Take 1 tablet by mouth every 6 (six) hours as needed for severe pain. 09/10/21   Doretha Folks, MD  polyethylene glycol (MIRALAX ) 17 g packet Take 17 g by mouth daily. 02/28/24   Silver Wonda LABOR, PA  rosuvastatin (CRESTOR) 10 MG tablet Take 10 mg by mouth daily. 02/22/17   [provider]  tamsulosin  (FLOMAX ) 0.4 MG CAPS capsule Take 1  capsule (0.4 mg total) by mouth daily after supper. 09/10/21   Doretha Folks, MD  terbinafine (LAMISIL) 250 MG tablet Take 250 mg by mouth daily. 03/20/17   [provider]  topiramate (TOPAMAX) 50 MG tablet Take 50 mg by mouth daily. 03/23/17   [provider]    Allergies: Meperidine    Review of Systems  Updated Vital Signs There were no vitals taken for this visit.  Physical Exam  (all labs ordered are listed, but only abnormal results are displayed) Labs Reviewed - No data to display  EKG: None  Radiology: No results found.  {Document cardiac monitor, telemetry assessment procedure when appropriate:32947} Procedures   Medications Ordered in the ED - No data to display    {Click here for ABCD2, HEART and other calculators REFRESH Note before signing:1}                              Medical Decision Making  ***  {Document critical care time when appropriate  Document review of labs and clinical decision tools ie [id number], etc  Document your independent review of radiology images and any outside records  Document your discussion with family members, caretakers and with consultants  Document social determinants of health affecting pt's care  Document your decision making why or why not admission, treatments were needed:32947:::1}   Final diagnoses:  None    ED Discharge Orders     None

## 2024-04-24 ENCOUNTER — Other Ambulatory Visit (HOSPITAL_BASED_OUTPATIENT_CLINIC_OR_DEPARTMENT_OTHER): Payer: Self-pay | Admitting: Neurosurgery

## 2024-04-24 DIAGNOSIS — M5416 Radiculopathy, lumbar region: Secondary | ICD-10-CM
